# Patient Record
Sex: Female | Born: 1975 | Race: White | Hispanic: No | Marital: Married | Smoking: Never smoker
Health system: Southern US, Community
[De-identification: ages and names within clinical notes are randomized; demographics above are authoritative.]

## PROBLEM LIST (undated history)

## (undated) DIAGNOSIS — R002 Palpitations: Secondary | ICD-10-CM

## (undated) DIAGNOSIS — E7800 Pure hypercholesterolemia, unspecified: Secondary | ICD-10-CM

## (undated) DIAGNOSIS — G43909 Migraine, unspecified, not intractable, without status migrainosus: Secondary | ICD-10-CM

## (undated) DIAGNOSIS — N200 Calculus of kidney: Secondary | ICD-10-CM

## (undated) DIAGNOSIS — G43009 Migraine without aura, not intractable, without status migrainosus: Secondary | ICD-10-CM

## (undated) DIAGNOSIS — F419 Anxiety disorder, unspecified: Secondary | ICD-10-CM

## (undated) DIAGNOSIS — K219 Gastro-esophageal reflux disease without esophagitis: Secondary | ICD-10-CM

## (undated) DIAGNOSIS — Z9889 Other specified postprocedural states: Secondary | ICD-10-CM

## (undated) HISTORY — DX: Anxiety disorder, unspecified: F41.9

## (undated) HISTORY — DX: Other specified postprocedural states: Z98.890

## (undated) HISTORY — DX: Calculus of kidney: N20.0

## (undated) HISTORY — DX: Migraine, unspecified, not intractable, without status migrainosus: G43.909

## (undated) HISTORY — DX: Gastro-esophageal reflux disease without esophagitis: K21.9

## (undated) HISTORY — PX: OTHER SURGICAL HISTORY: SHX169

## (undated) HISTORY — DX: Pure hypercholesterolemia, unspecified: E78.00

## (undated) HISTORY — DX: Palpitations: R00.2

## (undated) HISTORY — DX: Migraine without aura, not intractable, without status migrainosus: G43.009

---
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
## 1997-07-12 ENCOUNTER — Emergency Department (HOSPITAL_COMMUNITY): Admission: EM | Admit: 1997-07-12 | Discharge: 1997-07-12 | Payer: Self-pay | Admitting: Emergency Medicine

## 1997-07-16 ENCOUNTER — Inpatient Hospital Stay (HOSPITAL_COMMUNITY): Admission: EM | Admit: 1997-07-16 | Discharge: 1997-07-17 | Payer: Self-pay | Admitting: Emergency Medicine

## 1999-09-12 ENCOUNTER — Other Ambulatory Visit: Admission: RE | Admit: 1999-09-12 | Discharge: 1999-09-12 | Payer: Self-pay | Admitting: *Deleted

## 2000-09-17 ENCOUNTER — Other Ambulatory Visit: Admission: RE | Admit: 2000-09-17 | Discharge: 2000-09-17 | Payer: Self-pay | Admitting: *Deleted

## 2001-09-10 ENCOUNTER — Other Ambulatory Visit: Admission: RE | Admit: 2001-09-10 | Discharge: 2001-09-10 | Payer: Self-pay | Admitting: Obstetrics & Gynecology

## 2003-01-28 ENCOUNTER — Emergency Department (HOSPITAL_COMMUNITY): Admission: EM | Admit: 2003-01-28 | Discharge: 2003-01-29 | Payer: Self-pay | Admitting: Emergency Medicine

## 2007-08-17 ENCOUNTER — Other Ambulatory Visit: Admission: RE | Admit: 2007-08-17 | Discharge: 2007-08-17 | Payer: Self-pay | Admitting: Obstetrics and Gynecology

## 2008-08-30 ENCOUNTER — Other Ambulatory Visit: Admission: RE | Admit: 2008-08-30 | Discharge: 2008-08-30 | Payer: Self-pay | Admitting: Obstetrics and Gynecology

## 2008-10-05 ENCOUNTER — Encounter: Admission: RE | Admit: 2008-10-05 | Discharge: 2008-10-05 | Payer: Self-pay | Admitting: Otolaryngology

## 2009-01-10 ENCOUNTER — Encounter: Payer: Self-pay | Admitting: Cardiology

## 2009-01-31 ENCOUNTER — Encounter: Payer: Self-pay | Admitting: Cardiology

## 2009-02-02 ENCOUNTER — Emergency Department (HOSPITAL_COMMUNITY): Admission: EM | Admit: 2009-02-02 | Discharge: 2009-02-03 | Payer: Self-pay | Admitting: Emergency Medicine

## 2009-02-07 ENCOUNTER — Ambulatory Visit (HOSPITAL_COMMUNITY): Admission: RE | Admit: 2009-02-07 | Discharge: 2009-02-07 | Payer: Self-pay | Admitting: Internal Medicine

## 2009-02-07 ENCOUNTER — Encounter: Payer: Self-pay | Admitting: Cardiology

## 2009-02-07 ENCOUNTER — Encounter (INDEPENDENT_AMBULATORY_CARE_PROVIDER_SITE_OTHER): Payer: Self-pay | Admitting: Internal Medicine

## 2009-02-07 ENCOUNTER — Ambulatory Visit: Payer: Self-pay | Admitting: Cardiology

## 2009-02-09 ENCOUNTER — Encounter: Payer: Self-pay | Admitting: Cardiology

## 2009-02-09 ENCOUNTER — Ambulatory Visit (HOSPITAL_COMMUNITY): Admission: RE | Admit: 2009-02-09 | Discharge: 2009-02-09 | Payer: Self-pay | Admitting: Internal Medicine

## 2009-03-06 DIAGNOSIS — Z9889 Other specified postprocedural states: Secondary | ICD-10-CM

## 2009-03-06 HISTORY — DX: Other specified postprocedural states: Z98.890

## 2009-03-14 DIAGNOSIS — R002 Palpitations: Secondary | ICD-10-CM | POA: Insufficient documentation

## 2009-03-15 ENCOUNTER — Ambulatory Visit: Payer: Self-pay | Admitting: Gastroenterology

## 2009-03-16 ENCOUNTER — Ambulatory Visit: Payer: Self-pay | Admitting: Cardiology

## 2009-03-16 DIAGNOSIS — K3189 Other diseases of stomach and duodenum: Secondary | ICD-10-CM | POA: Insufficient documentation

## 2009-03-16 DIAGNOSIS — R1013 Epigastric pain: Secondary | ICD-10-CM

## 2009-03-19 ENCOUNTER — Encounter: Payer: Self-pay | Admitting: Cardiology

## 2009-03-20 ENCOUNTER — Ambulatory Visit (HOSPITAL_COMMUNITY): Admission: RE | Admit: 2009-03-20 | Discharge: 2009-03-20 | Payer: Self-pay | Admitting: Gastroenterology

## 2009-03-20 ENCOUNTER — Ambulatory Visit: Payer: Self-pay | Admitting: Gastroenterology

## 2009-03-28 ENCOUNTER — Telehealth (INDEPENDENT_AMBULATORY_CARE_PROVIDER_SITE_OTHER): Payer: Self-pay | Admitting: *Deleted

## 2009-05-02 ENCOUNTER — Ambulatory Visit: Payer: Self-pay | Admitting: Gastroenterology

## 2009-05-18 ENCOUNTER — Encounter: Admission: RE | Admit: 2009-05-18 | Discharge: 2009-05-18 | Payer: Self-pay | Admitting: Otolaryngology

## 2009-09-03 ENCOUNTER — Other Ambulatory Visit: Admission: RE | Admit: 2009-09-03 | Discharge: 2009-09-03 | Payer: Self-pay | Admitting: Obstetrics and Gynecology

## 2009-09-03 ENCOUNTER — Encounter (INDEPENDENT_AMBULATORY_CARE_PROVIDER_SITE_OTHER): Payer: Self-pay | Admitting: *Deleted

## 2009-10-01 ENCOUNTER — Encounter: Payer: Self-pay | Admitting: Gastroenterology

## 2009-12-08 ENCOUNTER — Emergency Department (HOSPITAL_COMMUNITY)
Admission: EM | Admit: 2009-12-08 | Discharge: 2009-12-08 | Payer: Self-pay | Source: Home / Self Care | Admitting: Emergency Medicine

## 2010-02-05 NOTE — Letter (Signed)
Summary: US ABDOMEN  US ABDOMEN   Imported By: Faythe Ghee 03/19/2009 13:25:20  _____________________________________________________________________  External Attachment:    Type:   Image     Comment:   External Document

## 2010-02-05 NOTE — Letter (Signed)
Summary: NEXIUM 40MG   NEXIUM 40MG    Imported By: Rexene Alberts 10/01/2009 10:18:29  _____________________________________________________________________  External Attachment:    Type:   Image     Comment:   External Document  Appended Document: NEXIUM 40MG     Prescriptions: NEXIUM 40 MG CPDR (ESOMEPRAZOLE MAGNESIUM) Take 1 tablet by mouth 30 minute before she eats two times a day  #60 x 3   Entered and Authorized by:   Gerrit Halls NP   Signed by:   Gerrit Halls NP on 10/01/2009   Method used:   Faxed to ...       [facility] (retail)       [address]       [place], [place]  [address]       Ph: [phone]       Fax: [phone]   RxID:   [id number]

## 2010-02-05 NOTE — Letter (Signed)
Summary: Handout Printed  Printed Handout:  - Mitral Valve Prolapse (MVP)

## 2010-02-05 NOTE — Letter (Signed)
Summary: EKG  EKG   Imported By: Faythe Ghee 03/19/2009 13:27:07  _____________________________________________________________________  External Attachment:    Type:   Image     Comment:   External Document

## 2010-02-05 NOTE — Medication Information (Signed)
Summary: Tax adviser   Imported By: Rosine Beat 09/03/2009 09:15:15  _____________________________________________________________________  External Attachment:    Type:   Image     Comment:   External Document

## 2010-02-05 NOTE — Letter (Signed)
Summary: 2-D ECHO  2-D ECHO   Imported By: Faythe Ghee 03/19/2009 13:24:50  _____________________________________________________________________  External Attachment:    Type:   Image     Comment:   External Document

## 2010-02-05 NOTE — Letter (Signed)
Summary: EGD ORDER  EGD ORDER   Imported By: Ave Filter 03/15/2009 16:10:52  _____________________________________________________________________  External Attachment:    Type:   Image     Comment:   External Document

## 2010-02-05 NOTE — Assessment & Plan Note (Signed)
Summary: **per Dr.FAgan for palpitations/heart racing/tg   Visit Type:  Initial Consult Primary Provider:  Dr.Fagan  CC:  palps and heart racing.  History of Present Illness: Bridget Parker is a 35 year old married white female referred by Dr. Ouida Sills for chest pain and palpitations.  Her symptoms are started the past month or so. Before that she was exercising on a regular basis doing aerobics. At that time she was having no symptoms of presyncope or syncope, chest pain, or palpitations.  A 2-D echocardiogram was obtained favor the second 2011. It was normal except for some systolic bowing of the anterior leaflet of the mitral valve. There was no definite prolapse. No significant mitral regurgitation was seen.  Her palpitations are random. She is very anxious about this. Her chest pain is moving from different areas of her chest it is described sometimes as sharp stabbing pain.  She was seen by Dr. Darrick Penna of gastroenterology yesterday. She probably has some reflux symptoms. She is scheduled for endoscopy.  Current Medications (verified): 1)  Lorazepam 0.5 Mg Tabs (Lorazepam) .... As Needed 2)  Nexium 40 Mg Cpdr (Esomeprazole Magnesium) .... Take 1 Tablet By Mouth 30 Minute Before She Eats Two Times A Day 3)  Gas -X .Marland Kitchen.. As Needed 4)  Tums .... As Needed 5)  Nasonex 50 Mcg/act Susp (Mometasone Furoate) .... As Directed 6)  Allergy Shots .... One Weekly 7)  Lidocaine Viscous 2 % Soln (Lidocaine Hcl) .... 2 Tsp Every 4-6 Hours As Needed For Chest Pain. Take It 10 Minutes Before Eating. 8)  Ativan 0.5 Mg Tabs (Lorazepam) .... As Needed  Allergies (verified): 1)  ! Sulfa  Past History:  Past Medical History: Last updated: 03/14/2009 Current Problems:  PALPITATIONS (ICD-785.1)  Past Surgical History: Last updated: 03/14/2009 no surgical history noted  Review of Systems       negative other than history of present illness  Vital Signs:  Patient profile:   35 year old  female Weight:      120 pounds BMI:     22.76 Pulse rate:   68 / minute BP sitting:   128 / 78  Physical Exam  General:  pleasant, no acute distress, very anxious Head:  normocephalic and atraumatic Eyes:  PERRLA/EOM intact; conjunctiva and lids normal. Mouth:  Teeth, gums and palate normal. Oral mucosa normal. Neck:  Neck supple, no JVD. No masses, thyromegaly or abnormal cervical nodes. Chest Yunus Stoklosa:  no deformities or breast masses noted Lungs:  Clear bilaterally to auscultation and percussion. Heart:  regular rate and rhythm, normal S1-S2, intermittent click, no murmur. Abdomen:  Bowel sounds positive; abdomen soft and non-tender without masses, organomegaly, or hernias noted. No hepatosplenomegaly. Msk:  Back normal, normal gait. Muscle strength and tone normal. Pulses:  pulses normal in all 4 extremities Extremities:  No clubbing or cyanosis. Neurologic:  Alert and oriented x 3. Skin:  Intact without lesions or rashes. Psych:  Normal affect.   EKG  Procedure date:  03/16/2009  Findings:      sinus tachycardia, nonspecific ST segment changes, borderline short PR interval, normal QRS, normal QTC.  Impression & Recommendations:  Problem # 1:  PALPITATIONS (ICD-785.1) Assessment New I think these are benign and related to minimal mitral valve prolapse by exam and by flat closure of the mitral valve on echo. I've encouraged her to exercise, encourage good dietary habits and rest. I have encouraged her to go ahead and have her endoscopy. She probably has some reflux symptoms. She can use  her anxiety lytic p.r.n. I have reassured her that she will not need surgery and she can proceed with trying to get pregnant. I'll see her back p.r.n.  Problem # 2:  DYSPEPSIA (ICD-536.8)  Other Orders: Consultation Level IV (91478)  Patient Instructions: 1)  Your physician recommends that you schedule a follow-up appointment in: as needed  2)  Your physician recommends that you continue on  your current medications as directed. Please refer to the Current Medication list given to you today.

## 2010-02-05 NOTE — Assessment & Plan Note (Signed)
Summary: DYSPEPSIA, GASTRITIS   Visit Type:  Follow-up Visit Primary Care Provider:  Ouida Sills, M.D.  Chief Complaint:  F/U dyspepsia.  History of Present Illness: After the Nexium paineased up and no more pain. If it flares takes TUMs or Gas-x. No need for Elavil. Modifying diet. Staying away from spicy stuff. 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 Just got back from the beach. Still consumes milk and doesn't seem to bother her.  Nexium causes a more regular BM. BM was q2days and now qday.  Current Medications (verified): 1)  Nexium 40 Mg Cpdr (Esomeprazole Magnesium) .... Take 1 Tablet By Mouth 30 Minute Before She Eats Two Times A Day 2)  Gas -X .Marland Kitchen.. As Needed 3)  Tums .... As Needed 4)  Allergy Shots .... One Weekly 5)  Flonase 50 Mcg/act Susp (Fluticasone Propionate) .... Daily As Directed 6)  Zyrtec Hives Relief 10 Mg Tabs (Cetirizine Hcl) .... Take 1 Tablet By Mouth Once A Day 7)  Tylenol .... As Needed 8)  Benadryl .... As Needed 9)  Multi-Vitamin .... Take 1 Tablet By Mouth Once A Day  Allergies (verified): 1)  ! Sulfa  Past History:  Past Medical History: Reviewed history from 03/15/2009 and no changes required. Current Problems:  PALPITATIONS (ICD-785.1) Anxiety Disorder GERD  Social History: Married x 8 years. Trying to have kids. Occupation: former Haematologist, unemployed since 2009 Patient has never smoked.  Alcohol Use - no Illicit Drug Use - no  Vital Signs:  Patient profile:   35 year old female Height:      61 inches Weight:      116 pounds BMI:     22.00 Temp:     98.1 degrees F oral Pulse rate:   72 / minute BP sitting:   102 / 70  (left arm) Cuff size:   regular  Vitals Entered By: Cloria Spring LPN (May 02, 2009 10:38 AM)  Physical Exam  General:  Well developed, well nourished, no acute distress. Head:  Normocephalic and atraumatic. Eyes:  PERRLA, no icterus. Mouth:  No deformity or lesions, dentition normal. Neck:  Supple; no  masses. Lungs:  Clear throughout to auscultation. Heart:  Regular rate and rhythm; no murmurs. Abdomen:  Soft, nontender and nondistended. Normal bowel sounds.  Impression & Recommendations:  Problem # 1:  DYSPEPSIA (ICD-536.8) Assessment Improved  2o to gastritis. Continue Nexium BID. In June 2011 may try once daily dosing. OPV in 12 mos.   CC: PCP  Orders: Est. Patient Level II (64403)  Appended Document: DYSPEPSIA, GASTRITIS reminder in computer

## 2010-02-05 NOTE — Progress Notes (Signed)
Summary: palpitations  Phone Note Call from Patient Call back at Home Phone [phone]   Caller: pt Reason for Call: Talk to Nurse Summary of Call: per pt calling because last night she was laying bed and her heart started racing, plus it happened during the day when she was out and about.  Initial call taken by: Faythe Ghee,  March 28, 2009 2:54 PM  Follow-up for Phone Call        I spoke with Caryl Asp at length, she has no snyope with these episodes, saw Dr. Daleen Squibb on 3/11, with echo, believed to be anxiety per MD, gave pt info for [facility] and call us if needed Follow-up by: Teressa Lower RN,  March 29, 2009 9:02 AM

## 2010-02-05 NOTE — Letter (Signed)
Summary: LABS  LABS   Imported By: Faythe Ghee 03/19/2009 13:25:54  _____________________________________________________________________  External Attachment:    Type:   Image     Comment:   External Document

## 2010-02-05 NOTE — Assessment & Plan Note (Signed)
Summary: NPP,GERD,GLU   Visit Type:  Initial Consult Referring Provider:  Ouida Sills Primary Care Provider:  Ouida Sills, M.D.  Chief Complaint:  GERD.  History of Present Illness: Having pain in left chest and middle of chest. Taking Nexium for past week. Failed OMP for 3-4 weeks. Comes on and feels like gonna have a heart attack. Cardiac workup pending.  Sharp shooting: starte a month ago. Always had indigestion(age 20s), rx: Tagamet.TUMS and it helped. Lost weight: everytime she eats gets pain, nausea. gets hungry but has no appetite. Taking Gas-x: doesn't really help. No ASA, BC, goody, or Ibuprofen. Aleve when she has a headache. Taking Allergy shots in SEP. No pain with swallowing and no problems swallowing. on Ativan cause she feels like she's having a MI. Stressed about the chest pain. No other concerns. Diarrhea eating things that don't agree with her. No blood in stool or constip[ation. No black stool.  Preventive Screening-Counseling & Management  Alcohol-Tobacco     Smoking Status: never      Drug Use:  no.    Current Medications (verified): 1)  Lorazepam 0.5 Mg Tabs (Lorazepam) .... As Needed 2)  Nexium 40 Mg Cpdr (Esomeprazole Magnesium) .... Take 1 Tablet By Mouth Two Times A Day 3)  Gas -X .Marland Kitchen.. As Needed 4)  Tums .... As Needed 5)  Nasonex 50 Mcg/act Susp (Mometasone Furoate) .... As Directed 6)  Allergy Shots .... One Weekly  Allergies (verified): 1)  ! Sulfa  Past History:  Past Medical History: Current Problems:  PALPITATIONS (ICD-785.1) Anxiety Disorder GERD  Past Surgical History: Kodney stones in her 86s Tonsillectomy  Family History: FH of Colon Cancer: ? grandfather Mother has reflux bad-no surgery  Social History: Married x 8 years. Trying to have kids. 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  Alcohol Use - no Illicit Drug Use - no Smoking Status:  never Drug Use:  no  Review of Systems       2007: 135 lbs. No H. pylori serology  checked. Was consuming milk w/ cereal every day-->1x/week. No cheese or ice cream.  Per HPI otherwise all systems.  Vital Signs:  Patient profile:   35 year old female Height:      61 inches Weight:      120 pounds BMI:     22.76 Temp:     98.0 degrees F oral Pulse rate:   80 / minute BP sitting:   100 / 80  (left arm) Cuff size:   regular  Vitals Entered By: Cloria Spring LPN (March 15, 2009 3:12 PM)  Impression & Recommendations: May drink Ensure. [facility] GERD handout. Boost or Ensure three times a day. EGD ASAP.  Other Orders: Consultation Level IV (29562) Prescriptions: LIDOCAINE VISCOUS 2 % SOLN (LIDOCAINE HCL) 2 tsp every 4-6 hours as needed for chest pain. Take it 10 minutes before eating.  #200 ml x 5   Entered and Authorized by:   West Bali MD   Signed by:   West Bali MD on 03/15/2009   Method used:   Electronically to        [facility] (retail)       [address]       [place], [place]  [address]       Ph: [phone]       Fax: [phone]   RxID:   [id number] NEXIUM 40 MG CPDR (ESOMEPRAZOLE MAGNESIUM) Take 1 tablet by mouth 30  minute before she eats two times a day  #60 x 5   Entered and Authorized by:   West Bali MD   Signed by:   West Bali MD on 03/15/2009   Method used:   Electronically to        [facility] (retail)       [address]       [place], [place]  [address]       Ph: [phone]       Fax: [phone]   RxID:   [id number]      Appended Document: NPP,GERD,GLU Pt has chest and epigastric pain. Differential diagnosis includes uncontrolled GERD, reflux esophagitis, H. pylori gastritis, and low likelihood of lactose intolerance, celiac sprue, or eosinophilic GE.   Appended Document: NPP,GERD,GLU AUG 2006: c/o chest pain, ? GERD, Rx: Protonix NOV 2009: 129 LBS APR 2010: 131 LBS JAN 2011: 134 LBS FEB 2011: cip 250 mg two times a  day for 3 days. Begin Paxil 10 mg once daily. ECHO: NL, ABD U/s: WNLS, WBC 10.6 HB 13.6 PLT 380 K 4.4 CR 0.79 TSH 2.325

## 2010-02-05 NOTE — Letter (Signed)
Summary: PROGRESS NOTE  PROGRESS NOTE   Imported By: Faythe Ghee 03/19/2009 13:30:17  _____________________________________________________________________  External Attachment:    Type:   Image     Comment:   External Document

## 2010-03-19 LAB — DIFFERENTIAL
Basophils Absolute: 0 10*3/uL (ref 0.0–0.1)
Basophils Relative: 0 % (ref 0–1)
Eosinophils Absolute: 0.1 10*3/uL (ref 0.0–0.7)
Eosinophils Relative: 1 % (ref 0–5)
Lymphocytes Relative: 24 % (ref 12–46)
Lymphs Abs: 2.3 10*3/uL (ref 0.7–4.0)
Monocytes Absolute: 0.8 10*3/uL (ref 0.1–1.0)
Monocytes Relative: 8 % (ref 3–12)
Neutro Abs: 6.5 10*3/uL (ref 1.7–7.7)
Neutrophils Relative %: 67 % (ref 43–77)

## 2010-03-19 LAB — CBC
HCT: 39.3 % (ref 36.0–46.0)
Hemoglobin: 13.4 g/dL (ref 12.0–15.0)
MCH: 30.5 pg (ref 26.0–34.0)
MCHC: 34.1 g/dL (ref 30.0–36.0)
MCV: 89.5 fL (ref 78.0–100.0)
Platelets: 311 10*3/uL (ref 150–400)
RBC: 4.39 MIL/uL (ref 3.87–5.11)
RDW: 12.4 % (ref 11.5–15.5)
WBC: 9.7 10*3/uL (ref 4.0–10.5)

## 2010-03-19 LAB — URINALYSIS, ROUTINE W REFLEX MICROSCOPIC
Bilirubin Urine: NEGATIVE
Glucose, UA: 250 mg/dL — AB
Leukocytes, UA: NEGATIVE
Nitrite: POSITIVE — AB
Protein, ur: 100 mg/dL — AB
Specific Gravity, Urine: 1.02 (ref 1.005–1.030)
Urobilinogen, UA: >8 mg/dL — ABNORMAL HIGH (ref 0.0–1.0)
pH: 6.5 (ref 5.0–8.0)

## 2010-03-19 LAB — URINE MICROSCOPIC-ADD ON

## 2010-03-19 LAB — BASIC METABOLIC PANEL
BUN: 9 mg/dL (ref 6–23)
CO2: 29 mEq/L (ref 19–32)
Calcium: 9.2 mg/dL (ref 8.4–10.5)
Chloride: 102 mEq/L (ref 96–112)
Creatinine, Ser: 0.84 mg/dL (ref 0.4–1.2)
GFR calc Af Amer: >60 mL/min (ref >60)
GFR calc non Af Amer: >60 mL/min (ref >60)
Glucose, Bld: 99 mg/dL (ref 70–99)
Potassium: 3.8 mEq/L (ref 3.5–5.1)
Sodium: 137 mEq/L (ref 135–145)

## 2010-03-25 LAB — URINALYSIS, ROUTINE W REFLEX MICROSCOPIC
Bilirubin Urine: NEGATIVE
Glucose, UA: NEGATIVE mg/dL
Hgb urine dipstick: NEGATIVE
Ketones, ur: NEGATIVE mg/dL
Nitrite: NEGATIVE
Protein, ur: NEGATIVE mg/dL
Specific Gravity, Urine: 1.01 (ref 1.005–1.030)
Urobilinogen, UA: 0.2 mg/dL (ref 0.0–1.0)
pH: 8 (ref 5.0–8.0)

## 2010-03-25 LAB — BASIC METABOLIC PANEL
BUN: 16 mg/dL (ref 6–23)
CO2: 28 mEq/L (ref 19–32)
Calcium: 9.8 mg/dL (ref 8.4–10.5)
Chloride: 102 mEq/L (ref 96–112)
Creatinine, Ser: 0.87 mg/dL (ref 0.4–1.2)
GFR calc Af Amer: >60 mL/min (ref >60)
GFR calc non Af Amer: >60 mL/min (ref >60)
Glucose, Bld: 127 mg/dL — ABNORMAL HIGH (ref 70–99)
Potassium: 3.3 mEq/L — ABNORMAL LOW (ref 3.5–5.1)
Sodium: 138 mEq/L (ref 135–145)

## 2010-03-25 LAB — DIFFERENTIAL
Basophils Absolute: 0 10*3/uL (ref 0.0–0.1)
Basophils Relative: 0 % (ref 0–1)
Eosinophils Absolute: 0.1 10*3/uL (ref 0.0–0.7)
Eosinophils Relative: 1 % (ref 0–5)
Lymphocytes Relative: 19 % (ref 12–46)
Lymphs Abs: 2.2 10*3/uL (ref 0.7–4.0)
Monocytes Absolute: 0.9 10*3/uL (ref 0.1–1.0)
Monocytes Relative: 8 % (ref 3–12)
Neutro Abs: 8.3 10*3/uL — ABNORMAL HIGH (ref 1.7–7.7)
Neutrophils Relative %: 72 % (ref 43–77)

## 2010-03-25 LAB — POCT CARDIAC MARKERS
CKMB, poc: <1 ng/mL — ABNORMAL LOW (ref 1.0–8.0)
Myoglobin, poc: 63.8 ng/mL (ref 12–200)
Troponin i, poc: <0.05 ng/mL (ref 0.00–0.09)

## 2010-03-25 LAB — PREGNANCY, URINE: Preg Test, Ur: NEGATIVE

## 2010-03-25 LAB — CBC
HCT: 39.7 % (ref 36.0–46.0)
Hemoglobin: 13.3 g/dL (ref 12.0–15.0)
MCHC: 33.7 g/dL (ref 30.0–36.0)
MCV: 93.1 fL (ref 78.0–100.0)
Platelets: 361 10*3/uL (ref 150–400)
RBC: 4.26 MIL/uL (ref 3.87–5.11)
RDW: 12.3 % (ref 11.5–15.5)
WBC: 11.5 10*3/uL — ABNORMAL HIGH (ref 4.0–10.5)

## 2010-05-20 ENCOUNTER — Other Ambulatory Visit: Payer: Self-pay

## 2010-05-22 MED ORDER — ESOMEPRAZOLE MAGNESIUM 40 MG PO CPDR: 40.0000 mg | DELAYED_RELEASE_CAPSULE | Freq: Every day | ORAL | Status: DC

## 2010-05-22 NOTE — Telephone Encounter (Signed)
Needs yearly OV.

## 2010-05-22 NOTE — Telephone Encounter (Signed)
Pt was informed, one refill given and needs yearly OV.

## 2010-05-29 NOTE — Telephone Encounter (Signed)
Pt is aware of OV for 05/30/10 @ 1000 with AS

## 2010-05-30 ENCOUNTER — Encounter: Payer: Self-pay | Admitting: Gastroenterology

## 2010-05-30 ENCOUNTER — Ambulatory Visit (INDEPENDENT_AMBULATORY_CARE_PROVIDER_SITE_OTHER): Payer: Self-pay | Admitting: Gastroenterology

## 2010-05-30 VITALS — BP 104/66 | HR 89 | Temp 98.0°F | Ht 61.0 in | Wt 118.0 lb

## 2010-05-30 DIAGNOSIS — K219 Gastro-esophageal reflux disease without esophagitis: Secondary | ICD-10-CM

## 2010-05-30 MED ORDER — ESOMEPRAZOLE MAGNESIUM 40 MG PO CPDR: 40.0000 mg | DELAYED_RELEASE_CAPSULE | Freq: Two times a day (BID) | ORAL | Status: AC

## 2010-05-30 NOTE — Progress Notes (Signed)
Referring Provider: No ref. provider found Primary Care Physician:  Carylon Perches, MD Primary Gastroenterologist: Dr. Darrick Penna   Chief Complaint  Patient presents with  . Medication Refill    HPI:   Ms. Bisaillon is a pleasant 35 year old female who presents in routine f/u for GERD. Needs refills. She has had a prior work-up to include [place] of abd, wnl, and an EGD in March 2011 showing gastritis. She has been on Nexium daily, doing well. At times she will take BID, but this is rare. GERD is much better than last year. Denies dysphagia. Continues to watch what she eats, avoiding spicy foods, which exacerbate GERD. Denies abdominal pain, n/v. BM regular. Feels Nexium has helped with this.   Past Medical History  Diagnosis Date  . GERD (gastroesophageal reflux disease)   . Anxiety disorder   . Palpitations   . S/P endoscopy March 2011    mild gastritis, continue Nexium    Past Surgical History  Procedure Date  . Kidney stones     IN HER 20s  . Tonsillectomy     Current Outpatient Prescriptions  Medication Sig Dispense Refill  . fluticasone (FLONASE) 50 MCG/ACT nasal spray 2 sprays by Nasal route daily.        Marland Kitchen LOESTRIN 24 FE 1-20 MG-MCG TABS       . Multiple Vitamin (MULTIVITAMIN) tablet Take 1 tablet by mouth daily.        . sertraline (ZOLOFT) 50 MG tablet       . DISCONTD: esomeprazole (NEXIUM) 40 MG capsule Take 1 capsule (40 mg total) by mouth daily.  31 capsule  0  . ALPRAZolam (XANAX) 0.25 MG tablet         Allergies as of 05/30/2010 - Review Complete 05/30/2010  Allergen Reaction Noted  . Sulfonamide derivatives      History   Social History  . Marital Status: Married    Spouse Name: N/A    Number of Children: 0  . Years of Education: N/A   Social History Main Topics  . Smoking status: Never Smoker   . Smokeless tobacco: None  . Alcohol Use: No  . Drug Use: No  . Sexually Active: None    Review of Systems: Gen: Denies fever, chills, anorexia. Denies fatigue,  weakness, weight loss.  CV: Denies chest pain, palpitations, syncope, peripheral edema, and claudication. Resp: Denies dyspnea at rest, cough, wheezing, coughing up blood, and pleurisy. GI: Denies vomiting blood, jaundice, and fecal incontinence.   Denies dysphagia or odynophagia. Derm: Denies rash, itching, dry skin Psych: Denies depression, anxiety, memory loss, confusion. No homicidal or suicidal ideation.  Heme: Denies bruising, bleeding, and enlarged lymph nodes.  Physical Exam: BP 104/66  Pulse 89  Temp(Src) 98 F (36.7 C) (Temporal)  Ht 5\' 1"  (1.549 m)  Wt 118 lb (53.524 kg)  BMI 22.30 kg/m2  LMP 05/09/2010 General:   Alert and oriented. No distress noted. Pleasant and cooperative.  Head:  Normocephalic and atraumatic. Eyes:  Conjuctiva clear without scleral icterus. Mouth:  Oral mucosa pink and moist. Good dentition. No lesions. Neck:  Supple, without mass or thyromegaly. Heart:  S1, S2 present without murmurs, rubs, or gallops. Regular rate and rhythm. Lungs: Clear to auscultation bilaterally Abdomen:  +BS, soft, non-tender and non-distended. No rebound or guarding. No HSM or masses noted. Msk:  Symmetrical without gross deformities. Normal posture. Extremities:  Without edema. Neurologic:  Alert and  oriented x4;  grossly normal neurologically. Skin:  Intact without significant lesions or  rashes. Psych:  Alert and cooperative. Normal mood and affect.

## 2010-05-30 NOTE — Progress Notes (Signed)
Cc to PCP 

## 2010-05-30 NOTE — Assessment & Plan Note (Signed)
35 year old Caucasian female with hx of GERD, well-controlled now on Nexium daily. At times will take BID. Prior work-up includes normal abdominal US, mild gastritis on EGD. No dysphagia, N/V. No abdominal pain. Doing quite well. Will refill Nexium, disp #62 with 11 refills. Pt informed to continue daily dosing, and may use BID if needed, although this is not likely.  Nexium refills Return in 2 years or sooner if needed

## 2010-05-30 NOTE — Patient Instructions (Signed)
Continue Nexium as discussed.  Contact our office if any changes, otherwise we will see you back in 2 years.  See prescription card with samples; this should hopefully help with the cost of Nexium monthly.

## 2010-06-07 NOTE — Progress Notes (Signed)
agree

## 2010-06-17 ENCOUNTER — Other Ambulatory Visit: Payer: Self-pay | Admitting: Gastroenterology

## 2010-10-11 ENCOUNTER — Other Ambulatory Visit (HOSPITAL_COMMUNITY)
Admission: RE | Admit: 2010-10-11 | Discharge: 2010-10-11 | Disposition: A | Discharge status: Home / Self Care | Payer: BC Managed Care – PPO | Source: Ambulatory Visit | Attending: Obstetrics and Gynecology | Admitting: Obstetrics and Gynecology

## 2010-10-11 ENCOUNTER — Other Ambulatory Visit: Payer: Self-pay | Admitting: Adult Health

## 2010-10-11 DIAGNOSIS — Z01419 Encounter for gynecological examination (general) (routine) without abnormal findings: Secondary | ICD-10-CM | POA: Insufficient documentation

## 2011-02-20 ENCOUNTER — Telehealth: Payer: Self-pay | Admitting: Gastroenterology

## 2011-02-20 MED ORDER — DEXLANSOPRAZOLE 60 MG PO CPDR: 60.0000 mg | DELAYED_RELEASE_CAPSULE | Freq: Every day | ORAL | Status: DC

## 2011-02-20 NOTE — Telephone Encounter (Signed)
Tried to call pt, NA. 

## 2011-02-20 NOTE — Telephone Encounter (Signed)
Has tried Protonix, Prevacid, Omeprazole, Tagament. Insurance will not cover Nexium unless trying Dexilant. Rx sent for Dexilant.  Please inform pt.

## 2011-02-21 NOTE — Telephone Encounter (Signed)
Pt is aware of Rx at the drug store

## 2012-01-29 ENCOUNTER — Other Ambulatory Visit: Payer: Self-pay | Admitting: Gastroenterology

## 2012-03-18 ENCOUNTER — Other Ambulatory Visit: Payer: Self-pay | Admitting: Physician Assistant

## 2012-05-16 ENCOUNTER — Encounter (HOSPITAL_COMMUNITY): Payer: Self-pay | Admitting: Emergency Medicine

## 2012-05-16 ENCOUNTER — Emergency Department (HOSPITAL_COMMUNITY)
Admission: EM | Admit: 2012-05-16 | Discharge: 2012-05-16 | Disposition: A | Discharge status: Home / Self Care | Payer: Managed Care, Other (non HMO) | Source: Home / Self Care

## 2012-05-16 DIAGNOSIS — J309 Allergic rhinitis, unspecified: Secondary | ICD-10-CM

## 2012-05-16 DIAGNOSIS — J302 Other seasonal allergic rhinitis: Secondary | ICD-10-CM

## 2012-05-16 LAB — POCT RAPID STREP A: Streptococcus, Group A Screen (Direct): NEGATIVE

## 2012-05-16 MED ORDER — AZITHROMYCIN 250 MG PO TABS: ORAL_TABLET | ORAL | Status: DC

## 2012-05-16 MED ORDER — FLUTICASONE PROPIONATE 50 MCG/ACT NA SUSP: 2.0000 | Freq: Every day | NASAL | Status: AC

## 2012-05-16 MED ORDER — FLUTICASONE PROPIONATE 50 MCG/ACT NA SUSP: 2.0000 | Freq: Every day | NASAL | Status: DC

## 2012-05-16 NOTE — ED Notes (Signed)
Pt waiting for rx then will discharge.

## 2012-05-16 NOTE — ED Provider Notes (Signed)
Bridget Parker is a 37 y.o. female who presents to Urgent Care today for stuffy runny nose sore throat cough itchy watery eyes and congestion along with sinus headache present for the last week or so. Patient has tried over-the-counter ibuprofen as well as allergy medication. She is using antihistamine eyedrops for her itchy eyes. She feels well otherwise with no fevers or chills shortness of breath. She thinks she may have a sinus infection or cold.    PMH reviewed. History of seasonal allergies History  Substance Use Topics  . Smoking status: Never Smoker   . Smokeless tobacco: Not on file  . Alcohol Use: No   ROS as above Medications reviewed. No current facility-administered medications for this encounter.   Current Outpatient Prescriptions  Medication Sig Dispense Refill  . ALPRAZolam (XANAX) 0.25 MG tablet       . DEXILANT 60 MG capsule TAKE (1) CAPSULE BY MOUTH EVERY DAY  30 capsule  11  . LOESTRIN 24 FE 1-20 MG-MCG TABS       . sertraline (ZOLOFT) 50 MG tablet       . azithromycin (ZITHROMAX) 250 MG tablet 2 pill po day 1 1 pill po days 2-5  6 each  0  . fluticasone (FLONASE) 50 MCG/ACT nasal spray Place 2 sprays into the nose daily.  16 g  6  . Multiple Vitamin (MULTIVITAMIN) tablet Take 1 tablet by mouth daily.          Exam:  BP 110/69  Pulse 80  Temp(Src) 98.6 F (37 C) (Oral)  Resp 16  SpO2 100%  LMP 05/06/2012 Gen: Well NAD HEENT: EOMI,  MMM, normal tympanic membranes bilaterally. Erythematous nasal turbinates. Posterior pharynx shows cobblestoning. No cervical lymphadenopathy.  Lungs: CTABL Nl WOB Heart: RRR no MRG Abd: NABS, NT, ND Exts: Non edematous BL  LE, warm and well perfused.   Results for orders placed during the hospital encounter of 05/16/12 (from the past 24 hour(s))  POCT RAPID STREP A ([facility] CARE ONLY)     Status: None   Collection Time    05/16/12 11:54 AM      Result Value Range   Streptococcus, Group A Screen (Direct) NEGATIVE  NEGATIVE    No results found.  Assessment and Plan: 37 y.o. female with seasonal allergies likely. Plan to treat with Flonase nasal spray and Zyrtec. Possible sinus infection however this is very unlikely. We'll prescribe Zyrtec for use if not better in one week. Discussed the plan with the patient expresses understanding and agreement.  Discussed warning signs or symptoms. Please see discharge instructions.     Rodolph Bong, MD 05/16/12 [phone]

## 2012-05-16 NOTE — ED Notes (Signed)
Pt c/o sore throat. Sinus pressure and pain. Sneezing and bilateral ear pain x 2 days  "hurts all over" pt has used otc meds with no relief in symptoms.  Pt denies fever, n/v/d

## 2012-05-17 NOTE — ED Provider Notes (Signed)
Medical screening examination/treatment/procedure(s) were performed by resident physician or non-physician practitioner and as supervising physician I was immediately available for consultation/collaboration.   Barkley Bruns MD.   Linna Hoff, MD 05/17/12 [facility]

## 2012-06-01 ENCOUNTER — Encounter: Payer: Self-pay | Admitting: General Practice

## 2012-08-02 ENCOUNTER — Telehealth: Payer: Self-pay | Admitting: Gastroenterology

## 2012-08-02 NOTE — Telephone Encounter (Signed)
LMOM that samples are ready for pick up. Dexilant 60 mg # 10 and one daily.

## 2012-08-02 NOTE — Telephone Encounter (Signed)
JL is working on a PA for patient to get her Dexilant refilled, but was checking to see if she could get a few samples to hold her until she is able to get rx filled. [phone] she is a [facility] patient

## 2012-08-02 NOTE — Telephone Encounter (Signed)
agree

## 2015-05-15 ENCOUNTER — Encounter: Payer: Self-pay | Admitting: Neurology

## 2015-05-15 ENCOUNTER — Ambulatory Visit (INDEPENDENT_AMBULATORY_CARE_PROVIDER_SITE_OTHER): Payer: BLUE CROSS/BLUE SHIELD | Admitting: Neurology

## 2015-05-15 VITALS — BP 114/77 | HR 91 | Ht 61.0 in | Wt 130.4 lb

## 2015-05-15 DIAGNOSIS — G43009 Migraine without aura, not intractable, without status migrainosus: Secondary | ICD-10-CM

## 2015-05-15 DIAGNOSIS — G43709 Chronic migraine without aura, not intractable, without status migrainosus: Secondary | ICD-10-CM | POA: Diagnosis not present

## 2015-05-15 MED ORDER — METOCLOPRAMIDE HCL 10 MG PO TABS: 10.0000 mg | ORAL_TABLET | Freq: Three times a day (TID) | ORAL | Status: DC | PRN

## 2015-05-15 MED ORDER — PROPRANOLOL HCL 10 MG PO TABS: 10.0000 mg | ORAL_TABLET | Freq: Two times a day (BID) | ORAL | Status: DC

## 2015-05-15 NOTE — Progress Notes (Signed)
[facility]    Provider:  Dr Lucia Gaskins Referring Provider: Benita Stabile, MD Primary Care Physician:  Dwana Melena, MD  CC:  Migraines  HPI:  Bridget Parker is a 40 y.o. female here as a referral from Dr. Margo Aye for migraines. Migraines started since the age of 3. Worsening in the last 3-5 years. She has 15 headaches a month. Pressure, throbbing, they start on both sides of the head but can start unilaterally on the left mostly. Typically behind the eyes, forehead, sides, pounding, throbbing. No light sensitivity or sound sensitivity. She has sensitivity to smells such as cleaning products or candles or tobacco. Smells trigger the headaches. +nausea but no vomiting. She has a pressure component to her headache. Headache is positional and worse with bending over. She has woken up with the headaches but they usually develop later in the day. They can last for days and they can be severe enough to keep her out work at 8/10 in pain. They are on average 7/10. They are bad. Significantly affecting her life. No FHx of migraines, mother has allergy headaches. The quality hasn;t changed over the years, but the severity and frequency is worse. Imitrex pill helps, she takes them 10x a month or so. She has tried Zoloft in the past without relief and with xanax and gained weight and she was tired a lot. She recently was prescribed Keppra. No weight gain recently. She was seen by a headache clinic recently and is here for a second opinion on management. No other focal neurologic symptoms. No vision changes at all, no hearing changing, no neck pain, no meningismus. No aura.   Reviewed notes, labs and imaging from outside physicians, which showed:  Tried: Keppra, Nortriptyline, Imitrex.  Personally reviewed images and agree with the following: MRI of the brain 09/2008 personally reviewed: The brain itself has a normal appearance without evidence of malformation, atrophy, old or acute infarction, mass  lesion, hemorrhage, hydrocephalus or extra-axial collection. The pituitary gland is normal. The paranasal sinuses, middle ears and mastoids are clear. There is no anomalous carotid or jugular anatomy. The seventh and eighth nerve complexes appear normal bilaterally. No sign of dural AV fistula. No abnormal contrast enhancement in the region.  IMPRESSION: Normal examination. No cause of pulsatile tinnitus identified.  Reviewed notes from [facility]: Imitrex helps headaches. She tried Nortriptyline but then stopped because she didn't want to be on an anti-depressant. Also did not want to gain weight. She was given Ketoprofen  prn, Imitrex  prn and Librium for anxiety. Melatonin at bedtime. 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 Diagnosed with migraine and tension type headache. They discussed acupuncture and cephaly, magnesium, roboflavin and coq10, beta blockers and anti-epilepsy medications used in migraine. He started Keppra  qhs with titration to  qhs. Oxaprozen prn.   Review of Systems: Patient complains of symptoms per HPI as well as the following symptoms: headache, insomnia, sleepiness, kidney stones. Pertinent negatives per HPI. All others negative.   Social History   Social History  . Marital Status: Married    Spouse Name: Bridget Parker   . Number of Children: 0  . Years of Education: some colle   Occupational History  . [employer]    Social History Main Topics  . Smoking status: Never Smoker   . Smokeless tobacco: Not on file  . Alcohol Use: No  . Drug Use: No  . Sexual Activity: Yes    Birth  Control/ Protection: Pill   Other Topics Concern  . Not on file   Social History Narrative   Lives with husband   Caffeine use: 1-3 times per week (coffee)    Family History  Problem Relation Age of Onset  . Prostate cancer      grandfather  . Lung cancer      grandfather  . Diabetes       grandfather  . Cancer      grandmother  . Ovarian cancer      Mother's sister (twin)  . Migraines Mother     Past Medical History  Diagnosis Date  . GERD (gastroesophageal reflux disease)   . Anxiety disorder   . Palpitations   . S/P endoscopy March 2011    mild gastritis, continue Nexium  . Kidney stones   . High cholesterol   . Migraines     Past Surgical History  Procedure Laterality Date  . Kidney stones      IN HER 20s  . Tonsillectomy  1983    Current Outpatient Prescriptions  Medication Sig Dispense Refill  . chlordiazePOXIDE (LIBRIUM) 5 MG capsule Take 5 mg by mouth every morning.    . fluticasone (FLONASE) 50 MCG/ACT nasal spray Place 2 sprays into the nose daily. 16 g 6  . LOESTRIN 24 FE 1-20 MG-MCG TABS     . metoCLOPramide (REGLAN) 10 MG tablet Take 1 tablet (10 mg total) by mouth 3 (three) times daily as needed. 30 tablet 12  . propranolol (INDERAL) 10 MG tablet Take 1 tablet (10 mg total) by mouth 2 (two) times daily. 60 tablet 12   No current facility-administered medications for this visit.    Allergies as of 05/15/2015 - Review Complete 05/15/2015  Allergen Reaction Noted  . Sulfonamide derivatives      Vitals: BP 114/77 mmHg  Pulse 91  Ht 5\' 1"  (1.549 m)  Wt 130 lb 6.4 oz (59.149 kg)  BMI 24.65 kg/m2 Last Weight:  Wt Readings from Last 1 Encounters:  05/15/15 130 lb 6.4 oz (59.149 kg)   Last Height:   Ht Readings from Last 1 Encounters:  05/15/15 5\' 1"  (1.549 m)   Physical exam: Exam: Gen: NAD, conversant                    CV: RRR, no MRG. No Carotid Bruits. No peripheral edema, warm, nontender Eyes: Conjunctivae clear without exudates or hemorrhage  Neuro: Detailed Neurologic Exam  Speech:    Speech is normal; fluent and spontaneous with normal comprehension.  Cognition:    The patient is oriented to person, place, and time;     recent and remote memory intact;     language fluent;     normal attention, concentration,      fund of knowledge Cranial Nerves:    The pupils are equal, round, and reactive to light. The fundi are normal and spontaneous venous pulsations are present. Visual fields are full to finger confrontation. Extraocular movements are intact. Trigeminal sensation is intact and the muscles of mastication are normal. The face is symmetric. The palate elevates in the midline. Hearing intact. Voice is normal. Shoulder shrug is normal. The tongue has normal motion without fasciculations.   Coordination:    Normal finger to nose and heel to shin. Normal rapid alternating movements.   Gait:    Heel-toe and tandem gait are normal.   Motor Observation:    No asymmetry, no atrophy, and no involuntary movements noted.  Tone:    Normal muscle tone.    Posture:    Posture is normal. normal erect    Strength:    Strength is V/V in the upper and lower limbs.      Sensation: intact to LT     Reflex Exam:  DTR's:    Deep tendon reflexes in the upper and lower extremities are normal bilaterally.   Toes:    The toes are downgoing bilaterally.   Clonus:    Clonus is absent.       Assessment/Plan:  40 year old with a PMHx of chronic migraines.   - Recommend MRI of the brain and MRI of the orbits to examine further, patient declines at this time. - Discussed headaches at length, different treatments, can't use Topiramate due to hx of nephrolithiasis.  - Start Propranolol. Common propranolol side effects may include:nausea, vomiting, diarrhea, constipation, stomach cramps; decreased sex drive, impotence, or difficulty having an orgasm;sleep problems (insomnia); or tired feeling. Do NOT get pregnant on this medication. Stop immediately for: slow or uneven heartbeats;a light-headed feeling, like you might pass out;wheezing or trouble breathing;shortness of breath (even with mild exertion), swelling, rapid weight gain;.sudden weakness, vision problems, or loss of coordination (especially in a child with  hemangioma that affects the face or head);cold feeling in your hands and feet;depression, confusion, hallucinations;liver problems - nausea, upper stomach pain, itching, tired feeling, loss of appetite, dark urine, clay-colored stools, jaundice (yellowing of the skin or eyes);low blood sugar - headache, hunger, weakness, sweating, confusion, irritability, dizziness, fast heart rate, or feeling jittery;low blood sugar in a baby - pale skin, blue or purple skin, sweating, fussiness, crying, not wanting to eat, feeling cold, drowsiness, weak or shallow breathing (breathing may stop for short periods), seizure (convulsions), or loss of consciousness; or severe skin reaction - fever, sore throat, swelling in your face or tongue, burning in your eyes, skin pain, followed by a red or purple skin rash that spreads (especially in the face or upper body) and causes blistering and peeling. - Order CMP today - Discussed Botox for migraine   To prevent or relieve headaches, try the following: Cool Compress. Lie down and place a cool compress on your head.  Avoid headache triggers. If certain foods or odors seem to have triggered your migraines in the past, avoid them. A headache diary might help you identify triggers.  Include physical activity in your daily routine. Try a daily walk or other moderate aerobic exercise.  Manage stress. Find healthy ways to cope with the stressors, such as delegating tasks on your to-do list.  Practice relaxation techniques. Try deep breathing, yoga, massage and visualization.  Eat regularly. Eating regularly scheduled meals and maintaining a healthy diet might help prevent headaches. Also, drink plenty of fluids.  Follow a regular sleep schedule. Sleep deprivation might contribute to headaches Consider biofeedback. With this mind-body technique, you learn to control certain bodily functions - such as muscle tension, heart rate and blood pressure - to prevent headaches or reduce  headache pain.    Proceed to emergency room if you experience new or worsening symptoms or symptoms do not resolve, if you have new neurologic symptoms or if headache is severe, or for any concerning symptom.    Naomie DeanAntonia Sharlee Rufino, MD  [facility] [address] [place], [place] [id number]  Phone [phone] Fax [phone]

## 2015-05-15 NOTE — Patient Instructions (Addendum)
Remember to drink plenty of fluid, eat healthy meals and do not skip any meals. Try to eat protein with a every meal and eat a healthy snack such as fruit or nuts in between meals. Try to keep a regular sleep-wake schedule and try to exercise daily, particularly in the form of walking, 20-30 minutes a day, if you can.   As far as your medications are concerned, I would like to suggest; Propranolol  As far as diagnostic testing: Lab today  Our phone number is [phone]. We also have an after hours call service for urgent matters and there is a physician on-call for urgent questions. For any emergencies you know to call 911 or go to the nearest emergency room

## 2015-05-16 ENCOUNTER — Telehealth: Payer: Self-pay | Admitting: *Deleted

## 2015-05-16 ENCOUNTER — Encounter: Payer: Self-pay | Admitting: Neurology

## 2015-05-16 DIAGNOSIS — G43709 Chronic migraine without aura, not intractable, without status migrainosus: Secondary | ICD-10-CM | POA: Insufficient documentation

## 2015-05-16 LAB — COMPREHENSIVE METABOLIC PANEL
ALT: 13 IU/L (ref 0–32)
AST: 17 IU/L (ref 0–40)
Albumin/Globulin Ratio: 1.6 (ref 1.2–2.2)
Albumin: 4.3 g/dL (ref 3.5–5.5)
Alkaline Phosphatase: 41 IU/L (ref 39–117)
BUN/Creatinine Ratio: 20 (ref 9–23)
BUN: 17 mg/dL (ref 6–20)
Bilirubin Total: <0.2 mg/dL (ref 0.0–1.2)
CO2: 23 mmol/L (ref 18–29)
Calcium: 9.9 mg/dL (ref 8.7–10.2)
Chloride: 103 mmol/L (ref 96–106)
Creatinine, Ser: 0.85 mg/dL (ref 0.57–1.00)
GFR calc Af Amer: 100 mL/min/{1.73_m2} (ref >59)
GFR calc non Af Amer: 87 mL/min/{1.73_m2} (ref >59)
Globulin, Total: 2.7 g/dL (ref 1.5–4.5)
Glucose: 90 mg/dL (ref 65–99)
Potassium: 4.4 mmol/L (ref 3.5–5.2)
Sodium: 141 mmol/L (ref 134–144)
Total Protein: 7 g/dL (ref 6.0–8.5)

## 2015-05-16 NOTE — Telephone Encounter (Signed)
LVM informing patient, per Dr Lucia GaskinsAhern, her lab results are normal. Left name, number for any questions.

## 2015-06-06 ENCOUNTER — Ambulatory Visit: Payer: Managed Care, Other (non HMO) | Admitting: Neurology

## 2015-06-06 ENCOUNTER — Encounter: Payer: Self-pay | Admitting: Neurology

## 2015-06-06 ENCOUNTER — Other Ambulatory Visit: Payer: Self-pay | Admitting: Neurology

## 2015-06-06 MED ORDER — SUMATRIPTAN SUCCINATE 100 MG PO TABS: 100.0000 mg | ORAL_TABLET | Freq: Once | ORAL | Status: DC | PRN

## 2015-06-08 DIAGNOSIS — F410 Panic disorder [episodic paroxysmal anxiety] without agoraphobia: Secondary | ICD-10-CM | POA: Diagnosis not present

## 2015-06-08 DIAGNOSIS — F411 Generalized anxiety disorder: Secondary | ICD-10-CM | POA: Diagnosis not present

## 2015-09-03 ENCOUNTER — Ambulatory Visit (INDEPENDENT_AMBULATORY_CARE_PROVIDER_SITE_OTHER): Payer: BLUE CROSS/BLUE SHIELD | Admitting: Neurology

## 2015-09-03 ENCOUNTER — Encounter: Payer: Self-pay | Admitting: Neurology

## 2015-09-03 VITALS — BP 103/77 | HR 65 | Ht 61.0 in | Wt 129.2 lb

## 2015-09-03 DIAGNOSIS — G43009 Migraine without aura, not intractable, without status migrainosus: Secondary | ICD-10-CM

## 2015-09-03 MED ORDER — PROPRANOLOL HCL 10 MG PO TABS: 20.0000 mg | ORAL_TABLET | Freq: Two times a day (BID) | ORAL | 12 refills | Status: DC

## 2015-09-03 NOTE — Progress Notes (Signed)
[facility]    Provider:  Dr Lucia Gaskins Referring Provider: Benita Stabile, MD Primary Care Physician:  Dwana Melena, MD  CC:  Migraines  Interval history: She is improving She has kept a migraine diary. Headaches have decreased, no side effects from propranolol. Discussed options, will increase propranolol and discussed side effects. The imitrex makes her tired, discussed trying other triptans, she did not tolerate the Reglan. She prefers not to try another triptan. Will just change propranolol. Headaches much improved from 15 a month to the following. She has kept a headache diary, discussed her headaches. May: 4 headaches after the 9th June:  4 headaches July: 5 headaches August: 6 headaches.  HPI:  Bridget Parker is a 40 y.o. female here as a referral from Dr. Margo Aye for migraines. Migraines started since the age of 9. Worsening in the last 3-5 years. She has 15 headaches a month. Pressure, throbbing, they start on both sides of the head but can start unilaterally on the left mostly. Typically behind the eyes, forehead, sides, pounding, throbbing. No light sensitivity or sound sensitivity. She has sensitivity to smells such as cleaning products or candles or tobacco. Smells trigger the headaches. +nausea but no vomiting. She has a pressure component to her headache. Headache is positional and worse with bending over. She has woken up with the headaches but they usually develop later in the day. They can last for days and they can be severe enough to keep her out work at 8/10 in pain. They are on average 7/10. They are bad. Significantly affecting her life. No FHx of migraines, mother has allergy headaches. The quality hasn;t changed over the years, but the severity and frequency is worse. Imitrex pill helps, she takes them 10x a month or so. She has tried Zoloft in the past without relief and with xanax and gained weight and she was tired a lot. She recently was prescribed Keppra. No  weight gain recently. She was seen by a headache clinic recently and is here for a second opinion on management. No other focal neurologic symptoms. No vision changes at all, no hearing changing, no neck pain, no meningismus. No aura.   Reviewed notes, labs and imaging from outside physicians, which showed:  Tried: Keppra, Nortriptyline(Did not want to take an antidepressant or gain weight), Imitrex(works), zoloft(side effects). Reglan (made her sick).   Personally reviewed images and agree with the following: MRI of the brain 09/2008 personally reviewed: The brain itself has a normal appearance without evidence of malformation, atrophy, old or acute infarction, mass lesion, hemorrhage, hydrocephalus or extra-axial collection. The pituitary gland is normal. The paranasal sinuses, middle ears and mastoids are clear. There is no anomalous carotid or jugular anatomy. The seventh and eighth nerve complexes appear normal bilaterally. No sign of dural AV fistula. No abnormal contrast enhancement in the region.  IMPRESSION: Normal examination. No cause of pulsatile tinnitus identified.  Reviewed notes from [facility]: Imitrex helps headaches. She tried Nortriptyline but then stopped because she didn't want to be on an anti-depressant. Also did not want to gain weight. She was given Ketoprofen 75mg  prn, Imitrex 100mg  prn and Librium for anxiety. Melatonin at bedtime. 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 Diagnosed with migraine and tension type headache. They discussed acupuncture and cephaly, magnesium, roboflavin and coq10, beta blockers and anti-epilepsy medications used in migraine. He started Keppra 250mg  qhs with titration to 750mg  qhs. Oxaprozen prn.   Review of  Systems: Patient complains of symptoms per HPI as well as the following symptoms: headache, insomnia, sleepiness, kidney stones. Pertinent negatives per HPI. All others negative.   Pertinent negatives per HPI. All others negative.   Social History   Social History  . Marital status: Married    Spouse name: Gerri SporeWesley   . Number of children: 0  . Years of education: some colle   Occupational History  . [employer]    Social History Main Topics  . Smoking status: Never Smoker  . Smokeless tobacco: Not on file  . Alcohol use No  . Drug use: No  . Sexual activity: Yes    Birth control/ protection: Pill   Other Topics Concern  . Not on file   Social History Narrative   Lives with husband   Caffeine use: 1-3 times per week (coffee)    Family History  Problem Relation Age of Onset  . Prostate cancer      grandfather  . Lung cancer      grandfather  . Diabetes      grandfather  . Cancer      grandmother  . Ovarian cancer      Mother's sister (twin)  . Migraines Mother     Past Medical History:  Diagnosis Date  . Anxiety disorder   . GERD (gastroesophageal reflux disease)   . High cholesterol   . Kidney stones   . Migraines   . Palpitations   . S/P endoscopy March 2011   mild gastritis, continue Nexium    Past Surgical History:  Procedure Laterality Date  . KIDNEY STONES     IN HER 20s  . TONSILLECTOMY  1983    Current Outpatient Prescriptions  Medication Sig Dispense Refill  . chlordiazePOXIDE (LIBRIUM) 5 MG capsule Take 5 mg by mouth every morning.    . fluticasone (FLONASE) 50 MCG/ACT nasal spray Place 2 sprays into the nose daily. 16 g 6  . LOESTRIN 24 FE 1-20 MG-MCG TABS     . propranolol (INDERAL) 10 MG tablet Take 1 tablet (10 mg total) by mouth 2 (two) times daily. 60 tablet 12  . SUMAtriptan (IMITREX) 100 MG tablet Take 1 tablet (100 mg total) by mouth once as needed for migraine. May repeat in 2 hours if headache persists or recurs. 10 tablet 12   No current facility-administered medications for this visit.     Allergies as of 09/03/2015 - Review Complete 09/03/2015  Allergen Reaction Noted  . Sulfonamide  derivatives      Vitals: BP 103/77   Pulse 65   Ht 5\' 1"  (1.549 m)   Wt 129 lb 3.2 oz (58.6 kg)   BMI 24.41 kg/m  Last Weight:  Wt Readings from Last 1 Encounters:  09/03/15 129 lb 3.2 oz (58.6 kg)   Last Height:   Ht Readings from Last 1 Encounters:  09/03/15 5\' 1"  (1.549 m)     Exam: Gen: NAD, conversant                    CV: RRR, no MRG. No Carotid Bruits. No peripheral edema, warm, nontender Eyes: Conjunctivae clear without exudates or hemorrhage  Neuro: Detailed Neurologic Exam  Speech:    Speech is normal; fluent and spontaneous with normal comprehension.  Cognition:    The patient is oriented to person, place, and time;     recent and remote memory intact;     language fluent;     normal attention, concentration,  fund of knowledge Cranial Nerves:    The pupils are equal, round, and reactive to light. The fundi are normal and spontaneous venous pulsations are present. Visual fields are full to finger confrontation. Extraocular movements are intact. Trigeminal sensation is intact and the muscles of mastication are normal. The face is symmetric. The palate elevates in the midline. Hearing intact. Voice is normal. Shoulder shrug is normal. The tongue has normal motion without fasciculations.   Coordination:    Normal finger to nose and heel to shin. Normal rapid alternating movements.   Gait:    Heel-toe and tandem gait are normal.   Motor Observation:    No asymmetry, no atrophy, and no involuntary movements noted. Tone:    Normal muscle tone.    Posture:    Posture is normal. normal erect    Strength:    Strength is V/V in the upper and lower limbs.      Sensation: intact to LT     Reflex Exam:  DTR's:    Deep tendon reflexes in the upper and lower extremities are normal bilaterally.   Toes:    The toes are downgoing bilaterally.   Clonus:    Clonus is absent.       Assessment/Plan:  40 year old with a PMHx of chronic  migraines.   - Recommend MRI of the brain and MRI of the orbits to examine further, patient declines at this time. - Discussed headaches at length, different treatments, can't use Topiramate due to hx of nephrolithiasis.  - Increase Propranolol. Common propranolol side effects may include:nausea, vomiting, diarrhea, constipation, stomach cramps; decreased sex drive, impotence, or difficulty having an orgasm;sleep problems (insomnia); or tired feeling. Do NOT get pregnant on this medication. Stop immediately for: slow or uneven heartbeats;a light-headed feeling, like you might pass out;wheezing or trouble breathing;shortness of breath (even with mild exertion), swelling, rapid weight gain;.sudden weakness, vision problems, or loss of coordination (especially in a child with hemangioma that affects the face or head);cold feeling in your hands and feet;depression, confusion, hallucinations;liver problems - nausea, upper stomach pain, itching, tired feeling, loss of appetite, dark urine, clay-colored stools, jaundice (yellowing of the skin or eyes);low blood sugar - headache, hunger, weakness, sweating, confusion, irritability, dizziness, fast heart rate, or feeling jittery;low blood sugar in a baby - pale skin, blue or purple skin, sweating, fussiness, crying, not wanting to eat, feeling cold, drowsiness, weak or shallow breathing (breathing may stop for short periods), seizure (convulsions), or loss of consciousness; or severe skin reaction - fever, sore throat, swelling in your face or tongue, burning in your eyes, skin pain, followed by a red or purple skin rash that spreads (especially in the face or upper body) and causes blistering and peeling. - Discussed Botox for migraine - Continue Imitrex for acute management   Discussed: To prevent or relieve headaches, try the following:  Cool Compress. Lie down and place a cool compress on your head.   Avoid headache triggers. If certain foods or odors  seem to have triggered your migraines in the past, avoid them. A headache diary might help you identify triggers.   Include physical activity in your daily routine. Try a daily walk or other moderate aerobic exercise.   Manage stress. Find healthy ways to cope with the stressors, such as delegating tasks on your to-do list.   Practice relaxation techniques. Try deep breathing, yoga, massage and visualization.   Eat regularly. Eating regularly scheduled meals and maintaining a healthy diet might  help prevent headaches. Also, drink plenty of fluids.   Follow a regular sleep schedule. Sleep deprivation might contribute to headaches  Consider biofeedback. With this mind-body technique, you learn to control certain bodily functions - such as muscle tension, heart rate and blood pressure - to prevent headaches or reduce headache pain.    Proceed to emergency room if you experience new or worsening symptoms or symptoms do not resolve, if you have new neurologic symptoms or if headache is severe, or for any concerning symptom.   Naomie Dean, MD  [facility] [address] [place], [place] [address]  Phone [phone] Fax [phone]  A total of 30 minutes was spent face-to-face with this patient. Over half this time was spent on counseling patient on the migraine diagnosis and different diagnostic and therapeutic options available.

## 2015-09-03 NOTE — Patient Instructions (Signed)
Remember to drink plenty of fluid, eat healthy meals and do not skip any meals. Try to eat protein with a every meal and eat a healthy snack such as fruit or nuts in between meals. Try to keep a regular sleep-wake schedule and try to exercise daily, particularly in the form of walking, 20-30 minutes a day, if you can.   As far as your medications are concerned, I would like to suggest: Increase Propranolol to 20mg  twice daily  I would like to see you back in 4-6 months, sooner if we need to. Please call us with any interim questions, concerns, problems, updates or refill requests.   Our phone number is [phone]. We also have an after hours call service for urgent matters and there is a physician on-call for urgent questions. For any emergencies you know to call 911 or go to the nearest emergency room

## 2015-11-23 DIAGNOSIS — G43909 Migraine, unspecified, not intractable, without status migrainosus: Secondary | ICD-10-CM | POA: Diagnosis not present

## 2015-11-23 DIAGNOSIS — Z6823 Body mass index (BMI) 23.0-23.9, adult: Secondary | ICD-10-CM | POA: Diagnosis not present

## 2015-11-23 DIAGNOSIS — Z01419 Encounter for gynecological examination (general) (routine) without abnormal findings: Secondary | ICD-10-CM | POA: Diagnosis not present

## 2015-11-23 DIAGNOSIS — F411 Generalized anxiety disorder: Secondary | ICD-10-CM | POA: Diagnosis not present

## 2015-11-23 DIAGNOSIS — F410 Panic disorder [episodic paroxysmal anxiety] without agoraphobia: Secondary | ICD-10-CM | POA: Diagnosis not present

## 2015-11-23 DIAGNOSIS — Z1151 Encounter for screening for human papillomavirus (HPV): Secondary | ICD-10-CM | POA: Diagnosis not present

## 2016-02-14 ENCOUNTER — Ambulatory Visit: Payer: BLUE CROSS/BLUE SHIELD | Admitting: Neurology

## 2016-03-13 ENCOUNTER — Ambulatory Visit (INDEPENDENT_AMBULATORY_CARE_PROVIDER_SITE_OTHER): Payer: PRIVATE HEALTH INSURANCE | Admitting: Neurology

## 2016-03-13 ENCOUNTER — Encounter: Payer: Self-pay | Admitting: Neurology

## 2016-03-13 VITALS — Ht 61.0 in | Wt 131.4 lb

## 2016-03-13 DIAGNOSIS — G43009 Migraine without aura, not intractable, without status migrainosus: Secondary | ICD-10-CM

## 2016-03-13 MED ORDER — SUMATRIPTAN SUCCINATE 100 MG PO TABS: 100.0000 mg | ORAL_TABLET | ORAL | 12 refills | Status: DC | PRN

## 2016-03-13 NOTE — Progress Notes (Signed)
[facility]    Provider:  Dr Lucia GaskinsAhern Referring Provider: Benita StabileHall, John Z, MD Primary Care Physician:  Dwana MelenaZack Hall, MD  CC: Migraines  Interval history: Some months she can go without a migraine. Jan and Feb she had none. This month she had a severe 4-day migraine with a heavy menses. Imitrex would make it go away and then she would have the headache the next day and have to take another imitrex. She stopped the propranolol and still the headaches have improved. Can't take Topiramate due to hx of kidney stones  Tried: Keppra, Nortriptyline(Did not want to take an antidepressant or gain weight), Imitrex(works), zoloft(side effects). Reglan (made her sick).   Interval history: She is improving She has kept a migraine diary. Headaches have decreased, no side effects from propranolol. Discussed options, will increase propranolol and discussed side effects. The imitrex makes her tired, discussed trying other triptans, she did not tolerate the Reglan. She prefers not to try another triptan. Will just change propranolol. Headaches much improved from 15 a month to the following. She has kept a headache diary, discussed her headaches. May: 4 headaches after the 9th June:  4 headaches July: 5 headaches August: 6 headaches.  VWU:JWJXBJHPI:Bridget L Priceis a 41 y.o.femalehere as a referral from Dr. Darlin CocoHallfor migraines. Migraines started since the age of 41. Worsening in the last 3-5 years. She has 15 headaches a month. Pressure, throbbing, they start on both sides of the head but can start unilaterally on the left mostly. Typically behind the eyes, forehead, sides, pounding, throbbing. No light sensitivity or sound sensitivity. She has sensitivity to smells such as cleaning products or candles or tobacco. Smells trigger the headaches. +nausea but no vomiting. She has a pressure component to her headache. Headache is positional and worse with bending over. She has woken up with the headaches but they  usually develop later in the day. They can last for days and they can be severe enough to keep her out work at 8/10 in pain. They are on average 7/10. They are bad. Significantly affecting her life. No FHx of migraines, mother has allergy headaches. The quality hasn;t changed over the years, but the severity and frequency is worse. Imitrex pill helps, she takes them 10x a month or so. She has tried Zoloft in the past without relief and with xanax and gained weight and she was tired a lot. She recently was prescribed Keppra. No weight gain recently. She was seen by a headache clinic recently and is here for a second opinion on management. No other focal neurologic symptoms. No vision changes at all, no hearing changing, no neck pain, no meningismus. No aura.   Reviewed notes, labs and imaging from outside physicians, which showed:  Tried: Keppra, Nortriptyline(Did not want to take an antidepressant or gain weight), Imitrex(works), zoloft(side effects). Reglan (made her sick).   Personally reviewed images and agree with the following: MRI of the brain 09/2008 personally reviewed: The brain itself has a normal appearance without evidence of malformation, atrophy, old or acute infarction, mass lesion, hemorrhage, hydrocephalus or extra-axial collection. The pituitary gland is normal. The paranasal sinuses, middle ears and mastoids are clear. There is no anomalous carotid or jugular anatomy. The seventh and eighth nerve complexes appear normal bilaterally. No sign of dural AV fistula. No abnormal contrast enhancement in the region.  IMPRESSION: Normal examination. No cause of pulsatile tinnitus identified.  Reviewed notes from [facility]: Imitrex helps headaches. She tried Nortriptyline but  then stopped because she didn't want to be on an anti-depressant. Also did not want to gain weight. She was given Ketoprofen 75mg  prn, Imitrex 100mg  prn and Librium for anxiety.  Melatonin at bedtime. 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 Diagnosed with migraine and tension type headache. They discussed acupuncture and cephaly, magnesium, roboflavin and coq10, beta blockers and anti-epilepsy medications used in migraine. He started Keppra 250mg  qhs with titration to 750mg  qhs. Oxaprozen prn.   Review of Systems: Patient complains of symptoms per HPI as well as the following symptoms: headache, insomnia, sleepiness, kidney stones. Pertinent negatives per HPI. All others negative.  Pertinent negatives per HPI. All others negative.  Social History   Social History  . Marital status: Married    Spouse name: Gerri Spore   . Number of children: 0  . Years of education: some colle   Occupational History  . [employer]    Social History Main Topics  . Smoking status: Never Smoker  . Smokeless tobacco: Never Used  . Alcohol use No  . Drug use: No  . Sexual activity: Yes    Birth control/ protection: Pill   Other Topics Concern  . Not on file   Social History Narrative   Lives with husband   Caffeine use: 1-3 times per week (coffee)   Right-handed    Family History  Problem Relation Age of Onset  . Migraines Mother   . Prostate cancer      grandfather  . Lung cancer      grandfather  . Diabetes      grandfather  . Cancer      grandmother  . Ovarian cancer      Mother's sister (twin)    Past Medical History:  Diagnosis Date  . Anxiety disorder   . GERD (gastroesophageal reflux disease)   . High cholesterol   . Kidney stones   . Migraines   . Palpitations   . S/P endoscopy March 2011   mild gastritis, continue Nexium    Past Surgical History:  Procedure Laterality Date  . KIDNEY STONES     IN HER 20s  . TONSILLECTOMY  1983    Current Outpatient Prescriptions  Medication Sig Dispense Refill  . chlordiazePOXIDE (LIBRIUM) 5 MG capsule Take 5 mg by mouth every morning.    . diphenhydrAMINE (BENADRYL) 25 MG  tablet Take 25 mg by mouth every 8 (eight) hours as needed.    . fluticasone (FLONASE) 50 MCG/ACT nasal spray Place 2 sprays into the nose daily. 16 g 6  . LOESTRIN 24 FE 1-20 MG-MCG TABS     . loratadine (CLARITIN) 10 MG tablet Take 10 mg by mouth daily.    . Melatonin 5 MG TABS Take 10 mg by mouth at bedtime.    . SUMAtriptan (IMITREX) 100 MG tablet Take 1 tablet (100 mg total) by mouth every 2 (two) hours as needed for migraine. May repeat in 2 hours if headache persists or recurs. 10 tablet 12   No current facility-administered medications for this visit.     Allergies as of 03/13/2016 - Review Complete 03/13/2016  Allergen Reaction Noted  . Sulfonamide derivatives      Vitals: Ht 5\' 1"  (1.549 m)   Wt 131 lb 6.4 oz (59.6 kg)   BMI 24.83 kg/m  Last Weight:  Wt Readings from Last 1 Encounters:  03/13/16 131 lb 6.4 oz (59.6 kg)   Last Height:   Ht Readings from Last 1  Encounters:  03/13/16 5\' 1"  (1.549 m)     Exam: Gen: NAD, conversant  CV: RRR, no MRG. No Carotid Bruits. No peripheral edema, warm, nontender Eyes: Conjunctivae clear without exudates or hemorrhage  Neuro: Detailed Neurologic Exam  Speech: Speech is normal; fluent and spontaneous with normal comprehension.  Cognition: The patient is oriented to person, place, and time;  recent and remote memory intact;  language fluent;  normal attention, concentration,  fund of knowledge Cranial Nerves: The pupils are equal, round, and reactive to light. The fundi are normal and spontaneous venous pulsations are present. Visual fields are full to finger confrontation. Extraocular movements are intact. Trigeminal sensation is intact and the muscles of mastication are normal. The face is symmetric. The palate elevates in the midline. Hearing intact. Voice is normal. Shoulder shrug is normal. The tongue has normal motion without fasciculations.   Coordination: Normal finger  to nose and heel to shin. Normal rapid alternating movements.   Gait: Heel-toe and tandem gait are normal.   Motor Observation: No asymmetry, no atrophy, and no involuntary movements noted. Tone: Normal muscle tone.   Posture: Posture is normal. normal erect  Strength: Strength is V/V in the upper and lower limbs.   Sensation: intact to LT  Reflex Exam:  DTR's: Deep tendon reflexes in the upper and lower extremities are normal bilaterally.  Toes: The toes are downgoing bilaterally.  Clonus: Clonus is absent.      Assessment/Plan:41 year old with a PMHx of chronic migraines which have improved with decrease in life stress.  At this time will continue acute management with imitrex and not preventative since patient's symptoms have improved.   Discussed: To prevent or relieve headaches, try the following: Cool Compress. Lie down and place a cool compress on your head.  Avoid headache triggers. If certain foods or odors seem to have triggered your migraines in the past, avoid them. A headache diary might help you identify triggers.  Include physical activity in your daily routine. Try a daily walk or other moderate aerobic exercise.  Manage stress. Find healthy ways to cope with the stressors, such as delegating tasks on your to-do list.  Practice relaxation techniques. Try deep breathing, yoga, massage and visualization.  Eat regularly. Eating regularly scheduled meals and maintaining a healthy diet might help prevent headaches. Also, drink plenty of fluids.  Follow a regular sleep schedule. Sleep deprivation might contribute to headaches Consider biofeedback. With this mind-body technique, you learn to control certain bodily functions - such as muscle tension, heart rate and blood pressure - to prevent headaches or reduce headache pain.    Proceed to emergency room if you experience new or worsening symptoms or symptoms do not  resolve, if you have new neurologic symptoms or if headache is severe, or for any concerning symptom.     Naomie Dean, MD  [facility] [address] [place], [place] [address]  Phone [phone] Fax [phone]  A total of 15 minutes was spent face-to-face with this patient. Over half this time was spent on counseling patient on the migraine diagnosis and different diagnostic and therapeutic options available

## 2016-03-13 NOTE — Patient Instructions (Signed)
Remember to drink plenty of fluid, eat healthy meals and do not skip any meals. Try to eat protein with a every meal and eat a healthy snack such as fruit or nuts in between meals. Try to keep a regular sleep-wake schedule and try to exercise daily, particularly in the form of walking, 20-30 minutes a day, if you can.   As far as your medications are concerned, I would like to suggest: Continue current medications  I would like to see you back in as needed, sooner if we need to. Please call us with any interim questions, concerns, problems, updates or refill requests.   Our phone number is [phone]. We also have an after hours call service for urgent matters and there is a physician on-call for urgent questions. For any emergencies you know to call 911 or go to the nearest emergency room   

## 2017-01-13 DIAGNOSIS — J209 Acute bronchitis, unspecified: Secondary | ICD-10-CM | POA: Diagnosis not present

## 2017-01-27 ENCOUNTER — Telehealth: Payer: Self-pay | Admitting: Nurse Practitioner

## 2017-01-27 ENCOUNTER — Encounter: Payer: Self-pay | Admitting: Neurology

## 2017-01-27 ENCOUNTER — Encounter: Payer: Self-pay | Admitting: Nurse Practitioner

## 2017-01-27 ENCOUNTER — Encounter: Payer: Self-pay | Admitting: *Deleted

## 2017-01-27 NOTE — Telephone Encounter (Signed)
----   Message from Mychart, Generic sent at 01/27/2017 10:31 AM EST -----    Appointment Request From: Bridget PriceKellie L Parker    With Provider: Anson FretAhern, Antonia B, MD [[facility]]    Preferred Date Range: 02/19/2017 - 03/10/2017    Preferred Times: Tuesday Afternoon, Thursday Afternoon    Reason for visit: Request an Appointment    Comments:  On 02/20/16 and 03/10/16 I am off work those days, I prefer afternoon appointments.  Thank you!  Bridget Parker DOB January 26, 1975

## 2017-01-27 NOTE — Telephone Encounter (Signed)
Called and LVM asking for call back to get patient scheduled.

## 2017-01-27 NOTE — Telephone Encounter (Signed)
Attempted to call patient. LVM asking for call back to schedule an appt.

## 2017-01-27 NOTE — Telephone Encounter (Signed)
Pt is being seen for migrainesand medication f/u, she is Dr Lucia GaskinsAhern pt, [facility]  FYI  See email 01/27/17

## 2017-01-27 NOTE — Telephone Encounter (Signed)
Scheduled pt through email encounter.

## 2017-01-27 NOTE — Telephone Encounter (Signed)
See other note. Patient has specific appt dates/times requested.

## 2017-01-29 ENCOUNTER — Ambulatory Visit: Payer: PRIVATE HEALTH INSURANCE | Admitting: Nurse Practitioner

## 2017-01-30 DIAGNOSIS — Z1231 Encounter for screening mammogram for malignant neoplasm of breast: Secondary | ICD-10-CM | POA: Diagnosis not present

## 2017-01-30 DIAGNOSIS — Z131 Encounter for screening for diabetes mellitus: Secondary | ICD-10-CM | POA: Diagnosis not present

## 2017-01-30 DIAGNOSIS — Z0000 Encounter for general adult medical examination without abnormal findings: Secondary | ICD-10-CM | POA: Diagnosis not present

## 2017-01-30 DIAGNOSIS — Z1329 Encounter for screening for other suspected endocrine disorder: Secondary | ICD-10-CM | POA: Diagnosis not present

## 2017-01-30 DIAGNOSIS — Z13220 Encounter for screening for lipoid disorders: Secondary | ICD-10-CM | POA: Diagnosis not present

## 2017-01-30 DIAGNOSIS — Z130 Encounter for screening for diseases of the blood and blood-forming organs and certain disorders involving the immune mechanism: Secondary | ICD-10-CM | POA: Diagnosis not present

## 2017-01-30 DIAGNOSIS — Z6824 Body mass index (BMI) 24.0-24.9, adult: Secondary | ICD-10-CM | POA: Diagnosis not present

## 2017-01-30 DIAGNOSIS — Z01419 Encounter for gynecological examination (general) (routine) without abnormal findings: Secondary | ICD-10-CM | POA: Diagnosis not present

## 2017-02-19 ENCOUNTER — Ambulatory Visit: Payer: Self-pay | Admitting: Neurology

## 2017-03-10 ENCOUNTER — Ambulatory Visit: Payer: PRIVATE HEALTH INSURANCE | Admitting: Neurology

## 2017-09-17 DIAGNOSIS — Z6823 Body mass index (BMI) 23.0-23.9, adult: Secondary | ICD-10-CM | POA: Diagnosis not present

## 2017-09-17 DIAGNOSIS — J019 Acute sinusitis, unspecified: Secondary | ICD-10-CM | POA: Diagnosis not present

## 2017-09-17 DIAGNOSIS — J3489 Other specified disorders of nose and nasal sinuses: Secondary | ICD-10-CM | POA: Diagnosis not present

## 2017-11-23 DIAGNOSIS — J019 Acute sinusitis, unspecified: Secondary | ICD-10-CM | POA: Diagnosis not present

## 2017-12-11 ENCOUNTER — Encounter: Payer: Self-pay | Admitting: Certified Nurse Midwife

## 2017-12-25 ENCOUNTER — Other Ambulatory Visit (HOSPITAL_COMMUNITY)
Admission: RE | Admit: 2017-12-25 | Discharge: 2017-12-25 | Disposition: A | Discharge status: Home / Self Care | Payer: BLUE CROSS/BLUE SHIELD | Source: Ambulatory Visit | Attending: Certified Nurse Midwife | Admitting: Certified Nurse Midwife

## 2017-12-25 ENCOUNTER — Encounter: Payer: Self-pay | Admitting: Certified Nurse Midwife

## 2017-12-25 ENCOUNTER — Other Ambulatory Visit: Payer: Self-pay

## 2017-12-25 ENCOUNTER — Ambulatory Visit: Payer: BLUE CROSS/BLUE SHIELD | Admitting: Certified Nurse Midwife

## 2017-12-25 VITALS — BP 118/72 | HR 70 | Resp 16 | Ht 61.25 in | Wt 126.0 lb

## 2017-12-25 DIAGNOSIS — Z8041 Family history of malignant neoplasm of ovary: Secondary | ICD-10-CM

## 2017-12-25 DIAGNOSIS — Z01419 Encounter for gynecological examination (general) (routine) without abnormal findings: Secondary | ICD-10-CM | POA: Diagnosis not present

## 2017-12-25 DIAGNOSIS — Z124 Encounter for screening for malignant neoplasm of cervix: Secondary | ICD-10-CM

## 2017-12-25 DIAGNOSIS — Z30011 Encounter for initial prescription of contraceptive pills: Secondary | ICD-10-CM

## 2017-12-25 DIAGNOSIS — E559 Vitamin D deficiency, unspecified: Secondary | ICD-10-CM

## 2017-12-25 DIAGNOSIS — Z0000 Encounter for general adult medical examination without abnormal findings: Secondary | ICD-10-CM

## 2017-12-25 MED ORDER — NORETHIN ACE-ETH ESTRAD-FE 1-20 MG-MCG(24) PO TABS: 1.0000 | ORAL_TABLET | Freq: Every day | ORAL | 12 refills | Status: DC

## 2017-12-25 NOTE — Patient Instructions (Signed)

## 2017-12-25 NOTE — Progress Notes (Signed)
42 y.o. G0P0000 Married  Caucasian Fe here to establish gyn care and  for annual exam. Contraception OCP since 42 years old. No issues with. Periods normal monthly and light with some cramping. Uses OTC medication if needed for cramping. Does have menstrual migraines, no aura ever. Sees Catalina PizzaZach Hall with PCP for sinus management, prn. Sees Dr. Lucia GaskinsAhern for migraine management prn. Has Imitrex for use as needed.Sees Dr. Evelene CroonKaur for panic attack management twice yearly with Librium use. No other health concerns today. Would like screening labs today.  Patient's last menstrual period was 12/10/2017 (exact date).          Sexually active: Yes.    The current method of family planning is OCP (estrogen/progesterone).    Exercising: No.  exercise Smoker:  no  Review of Systems  Constitutional: Negative.   HENT: Negative.   Eyes: Negative.   Respiratory: Negative.   Cardiovascular: Negative.   Gastrointestinal: Negative.   Genitourinary: Negative.   Musculoskeletal: Negative.   Skin: Negative.   Neurological: Negative.   Endo/Heme/Allergies: Negative.   Psychiatric/Behavioral: Negative.     Health Maintenance: Pap:  A yr ago neg per patient History of Abnormal Pap: no MMG:  01/2017 neg per patient Self Breast exam: no Colonoscopy:  none BMD:   none TDaP:  UTD Shingles: no Pneumonia: no Hep C and HIV: not done Labs: yes   reports that she has never smoked. She has never used smokeless tobacco. She reports that she does not drink alcohol or use drugs.  Past Medical History:  Diagnosis Date  . Anxiety disorder   . GERD (gastroesophageal reflux disease)   . High cholesterol   . Kidney stones   . Migraines   . Palpitations   . S/P endoscopy March 2011   mild gastritis, continue Nexium    Past Surgical History:  Procedure Laterality Date  . KIDNEY STONES     IN HER 20s stint  . TONSILLECTOMY  1983    Current Outpatient Medications  Medication Sig Dispense Refill  . chlordiazePOXIDE  (LIBRIUM) 5 MG capsule Take 5 mg by mouth every morning.    . fluticasone (FLONASE) 50 MCG/ACT nasal spray Place 2 sprays into the nose daily. 16 g 6  . LOESTRIN 24 FE 1-20 MG-MCG TABS     . loratadine (CLARITIN) 10 MG tablet Take 10 mg by mouth daily.    . Melatonin 5 MG TABS Take 10 mg by mouth at bedtime.    . Multiple Vitamins-Minerals (MULTIVITAMIN PO) Take by mouth.    . SUMAtriptan (IMITREX) 100 MG tablet Take 1 tablet (100 mg total) by mouth every 2 (two) hours as needed for migraine. May repeat in 2 hours if headache persists or recurs. 10 tablet 12   No current facility-administered medications for this visit.     Family History  Problem Relation Age of Onset  . Cancer Maternal Grandmother   . Prostate cancer Maternal Grandfather   . Colon cancer Maternal Grandfather   . Lung cancer Paternal Grandmother   . Diabetes Paternal Grandfather   . Ovarian cancer Maternal Aunt        moms identical twin    ROS:  Pertinent items are noted in HPI.  Otherwise, a comprehensive ROS was negative.  Exam:   BP 118/72   Pulse 70   Resp 16   Ht 5' 1.25" (1.556 m)   Wt 126 lb (57.2 kg)   LMP 12/10/2017 (Exact Date)   BMI 23.61 kg/m  Height:  5' 1.25" (155.6 cm) Ht Readings from Last 3 Encounters:  12/25/17 5' 1.25" (1.556 m)  03/13/16 5\' 1"  (1.549 m)  09/03/15 5\' 1"  (1.549 m)    General appearance: alert, cooperative and appears stated age Head: Normocephalic, without obvious abnormality, atraumatic Neck: no adenopathy, supple, symmetrical, trachea midline and thyroid normal to inspection and palpation Lungs: clear to auscultation bilaterally Breasts: normal appearance, no masses or tenderness, No nipple retraction or dimpling, No nipple discharge or bleeding, No axillary or supraclavicular adenopathy Heart: regular rate and rhythm Abdomen: soft, non-tender; no masses,  no organomegaly Extremities: extremities normal, atraumatic, no cyanosis or edema Skin: Skin color, texture,  turgor normal. No rashes or lesions Lymph nodes: Cervical, supraclavicular, and axillary nodes normal. No abnormal inguinal nodes palpated Neurologic: Grossly normal   Pelvic: External genitalia:  no lesions              Urethra:  normal appearing urethra with no masses, tenderness or lesions              Bartholin's and Skene's: normal                 Vagina: normal appearing vagina with normal color and discharge, no lesions              Cervix: no cervical motion tenderness, no lesions and nulliparous appearance              Pap taken: Yes.   Bimanual Exam:  Uterus:  normal size, contour, position, consistency, mobility, non-tender and anteverted              Adnexa: normal adnexa and no mass, fullness, tenderness               Rectovaginal: Confirms               Anus:  normal sphincter tone, no lesions  Chaperone present: yes  A:  Well Woman with normal exam  Contraception OCP desired  History of migraine no aura with Neurology management  History of panic attacks with Psychiatry management  Mammogram due 01/2018 patient to schedule with [facility] will call if issues with  Family history of ovarian cancer( mother's identical twin)  Screening labs  P:   Reviewed health and wellness pertinent to exam  Risks/benefits/warning signs with OCP reviewed, request RX  Rx Loestrin 24 Fe see order with instructions  Warning of migraine headache given and continue follow up with Neurology  Continue follow up with psychiatry as indicated.  Discussed no routine screening for Ovarian cancer except for PUS and CA 125 and limitations discussed. Discussed genetic consultation recommendation, patient will consider discussion and advise if she desires screening or referral to genetic.  Labs: CMP, Lipid panel, Vitamin D TSH,CBC  Pap smear: yes   counseled on breast self exam, mammography screening, use and side effects of OCP's, adequate intake of calcium and vitamin D, diet and exercise  return  annually or prn  An After Visit Summary was printed and given to the patient.

## 2017-12-26 LAB — COMPREHENSIVE METABOLIC PANEL
ALT: 13 IU/L (ref 0–32)
AST: 18 IU/L (ref 0–40)
Albumin/Globulin Ratio: 1.7 (ref 1.2–2.2)
Albumin: 4.3 g/dL (ref 3.5–5.5)
Alkaline Phosphatase: 46 IU/L (ref 39–117)
BUN/Creatinine Ratio: 22 (ref 9–23)
BUN: 17 mg/dL (ref 6–24)
Bilirubin Total: <0.2 mg/dL (ref 0.0–1.2)
CO2: 22 mmol/L (ref 20–29)
Calcium: 9.9 mg/dL (ref 8.7–10.2)
Chloride: 104 mmol/L (ref 96–106)
Creatinine, Ser: 0.78 mg/dL (ref 0.57–1.00)
GFR calc Af Amer: 109 mL/min/{1.73_m2} (ref >59)
GFR calc non Af Amer: 95 mL/min/{1.73_m2} (ref >59)
Globulin, Total: 2.6 g/dL (ref 1.5–4.5)
Glucose: 90 mg/dL (ref 65–99)
Potassium: 4.3 mmol/L (ref 3.5–5.2)
Sodium: 143 mmol/L (ref 134–144)
Total Protein: 6.9 g/dL (ref 6.0–8.5)

## 2017-12-26 LAB — LIPID PANEL
Chol/HDL Ratio: 3.4 ratio (ref 0.0–4.4)
Cholesterol, Total: 196 mg/dL (ref 100–199)
HDL: 57 mg/dL (ref >39)
LDL Calculated: 108 mg/dL — ABNORMAL HIGH (ref 0–99)
Triglycerides: 153 mg/dL — ABNORMAL HIGH (ref 0–149)
VLDL Cholesterol Cal: 31 mg/dL (ref 5–40)

## 2017-12-26 LAB — CBC
Hematocrit: 42.7 % (ref 34.0–46.6)
Hemoglobin: 14.2 g/dL (ref 11.1–15.9)
MCH: 30.9 pg (ref 26.6–33.0)
MCHC: 33.3 g/dL (ref 31.5–35.7)
MCV: 93 fL (ref 79–97)
Platelets: 365 10*3/uL (ref 150–450)
RBC: 4.59 x10E6/uL (ref 3.77–5.28)
RDW: 11.8 % — ABNORMAL LOW (ref 12.3–15.4)
WBC: 8.4 10*3/uL (ref 3.4–10.8)

## 2017-12-26 LAB — VITAMIN D 25 HYDROXY (VIT D DEFICIENCY, FRACTURES): Vit D, 25-Hydroxy: 34.8 ng/mL (ref 30.0–100.0)

## 2017-12-26 LAB — TSH: TSH: 2.15 u[IU]/mL (ref 0.450–4.500)

## 2017-12-28 ENCOUNTER — Telehealth: Payer: Self-pay | Admitting: Certified Nurse Midwife

## 2017-12-28 LAB — CYTOLOGY - PAP
Diagnosis: NEGATIVE
HPV: NOT DETECTED

## 2017-12-28 NOTE — Telephone Encounter (Signed)
Patient called and said she is returning a call with questions about her lab results.

## 2017-12-28 NOTE — Telephone Encounter (Signed)
-----   Message from Verner Choleborah S Leonard, CNM sent at 12/28/2017  8:39 AM EST ----- Notify patient her Liver, kidney, glucose screen is normal Lipid panel shows normal cholesterol at 196 normal <199 Triglycerides are just borderline elevated at 153 normal <149 HDL is normal at 57 >greater than is normal LDL is borderline elevate at 108 normal <99 Keep working on good diet with colorful fresh vegetables, fiber and lean meat, regular exercise TSH is normal Vitamin D is 34.8 CBC is essentially normal no anemia Pap pending

## 2017-12-28 NOTE — Telephone Encounter (Signed)
Spoke with patient, advised as seen below per Deborah Leonard, CNM. Patient verbalizes understanding and is agreeable.   Encounter closed.  

## 2018-01-01 DIAGNOSIS — F410 Panic disorder [episodic paroxysmal anxiety] without agoraphobia: Secondary | ICD-10-CM | POA: Diagnosis not present

## 2018-02-08 DIAGNOSIS — J060 Acute laryngopharyngitis: Secondary | ICD-10-CM | POA: Diagnosis not present

## 2018-03-04 ENCOUNTER — Ambulatory Visit (HOSPITAL_COMMUNITY)
Admission: RE | Admit: 2018-03-04 | Discharge: 2018-03-04 | Disposition: A | Discharge status: Home / Self Care | Payer: BLUE CROSS/BLUE SHIELD | Source: Ambulatory Visit | Attending: Certified Nurse Midwife | Admitting: Certified Nurse Midwife

## 2018-03-04 ENCOUNTER — Other Ambulatory Visit (HOSPITAL_COMMUNITY): Payer: Self-pay | Admitting: Certified Nurse Midwife

## 2018-03-04 DIAGNOSIS — Z1231 Encounter for screening mammogram for malignant neoplasm of breast: Secondary | ICD-10-CM

## 2018-03-08 DIAGNOSIS — J040 Acute laryngitis: Secondary | ICD-10-CM | POA: Diagnosis not present

## 2018-06-02 ENCOUNTER — Other Ambulatory Visit: Payer: Self-pay | Admitting: Neurology

## 2018-06-03 ENCOUNTER — Telehealth: Payer: Self-pay | Admitting: Neurology

## 2018-06-03 NOTE — Telephone Encounter (Signed)
5-28 Pt called and gave verbal consent to file insurance for a doxy.me vv Email confirmed as:[email or web address]  Pt understands that although there may be some limitations with this type of visit, we will take all precautions to reduce any security or privacy concerns.  Pt understands that this will be treated like an in office visit and we will file with pt's insurance, and there may be a patient responsible charge related to this service.

## 2018-06-03 NOTE — Telephone Encounter (Signed)
Noted  

## 2018-06-30 ENCOUNTER — Encounter: Payer: Self-pay | Admitting: Neurology

## 2018-06-30 NOTE — Addendum Note (Signed)
Addended by: Gildardo Griffes on: 06/30/2018 01:37 PM   Modules accepted: Orders

## 2018-06-30 NOTE — Telephone Encounter (Signed)
Pt returned my call. She confirmed her name & DOB and provided updates to her chart. She confirmed she received the link via email. She understands the plan for tomorrow. She verbalized appreciation for the call.

## 2018-06-30 NOTE — Telephone Encounter (Signed)
Called pt & LVM (ok per DPR) asking for call back to update chart before her VV with Dr. Jaynee Eagles tomorrow 6/25 at 3 pm. Left office number in message.

## 2018-07-01 ENCOUNTER — Ambulatory Visit (INDEPENDENT_AMBULATORY_CARE_PROVIDER_SITE_OTHER): Payer: BC Managed Care – PPO | Admitting: Neurology

## 2018-07-01 ENCOUNTER — Other Ambulatory Visit: Payer: Self-pay

## 2018-07-01 DIAGNOSIS — G43709 Chronic migraine without aura, not intractable, without status migrainosus: Secondary | ICD-10-CM | POA: Diagnosis not present

## 2018-07-01 MED ORDER — SUMATRIPTAN SUCCINATE 100 MG PO TABS: 100.0000 mg | ORAL_TABLET | ORAL | 12 refills | Status: DC | PRN

## 2018-07-01 MED ORDER — AJOVY 225 MG/1.5ML ~~LOC~~ SOAJ: 225.0000 mg | SUBCUTANEOUS | 11 refills | Status: DC

## 2018-07-01 NOTE — Progress Notes (Signed)
[facility]    Provider:  Dr Lucia GaskinsAhern Referring Provider: Benita StabileHall, John Z, MD Primary Care Physician:  Benita StabileHall, John Z, MD  CC: Migraines  Virtual Visit via Video Note  I connected with Bridget Parker on 07/05/18 at  3:00 PM EDT by a video enabled telemedicine application and verified that I am speaking with the correct person using two identifiers.  Location: Patient: office Provider: office   I discussed the limitations of evaluation and management by telemedicine and the availability of in person appointments. The patient expressed understanding and agreed to proceed.  Follow Up Instructions:    I discussed the assessment and treatment plan with the patient. The patient was provided an opportunity to ask questions and all were answered. The patient agreed with the plan and demonstrated an understanding of the instructions.   The patient was advised to call back or seek an in-person evaluation if the symptoms worsen or if the condition fails to improve as anticipated.  I provided 40 minutes of non-face-to-face time during this encounter.   Anson FretAhern, Jasia Hiltunen B, MD    Her migraines are worsening, especially around menses she had to stay out of work for 2 days. Ongoing for 6 months. They were severe. She did have them other days of the month. But it was the worse during period. Headaches around period severe. But this month she did not have severe migraines. Masks make it worse. She is having 8 moderately severe migraines a month 15 dull headache days   Interval history: Some months she can go without a migraine. Jan and Feb she had none. This month she had a severe 4-day migraine with a heavy menses. Imitrex would make it go away and then she would have the headache the next day and have to take another imitrex. She stopped the propranolol and still the headaches have improved. Can't take Topiramate due to hx of kidney stones  Tried: Keppra, Nortriptyline(Did not  want to take an antidepressant or gain weight), Imitrex(works), zoloft(side effects). Reglan (made her sick).   Interval history: She is improving She has kept a migraine diary. Headaches have decreased, no side effects from propranolol. Discussed options, will increase propranolol and discussed side effects. The imitrex makes her tired, discussed trying other triptans, she did not tolerate the Reglan. She prefers not to try another triptan. Will just change propranolol. Headaches much improved from 15 a month to the following. She has kept a headache diary, discussed her headaches. May: 4 headaches after the 9th June:  4 headaches July: 5 headaches August: 6 headaches.  ZOX:WRUEAVHPI:Bridget L Priceis a 43 y.o.femalehere as a referral from Dr. Darlin CocoHallfor migraines. Migraines started since the age of 43. Worsening in the last 3-5 years. She has 15 headaches a month. Pressure, throbbing, they start on both sides of the head but can start unilaterally on the left mostly. Typically behind the eyes, forehead, sides, pounding, throbbing. No light sensitivity or sound sensitivity. She has sensitivity to smells such as cleaning products or candles or tobacco. Smells trigger the headaches. +nausea but no vomiting. She has a pressure component to her headache. Headache is positional and worse with bending over. She has woken up with the headaches but they usually develop later in the day. They can last for days and they can be severe enough to keep her out work at 8/10 in pain. They are on average 7/10. They are bad. Significantly affecting her life. No FHx of migraines, mother has allergy headaches. The  quality hasn;t changed over the years, but the severity and frequency is worse. Imitrex pill helps, she takes them 10x a month or so. She has tried Zoloft in the past without relief and with xanax and gained weight and she was tired a lot. She recently was prescribed Keppra. No weight gain recently. She was seen by a headache  clinic recently and is here for a second opinion on management. No other focal neurologic symptoms. No vision changes at all, no hearing changing, no neck pain, no meningismus. No aura.   Reviewed notes, labs and imaging from outside physicians, which showed:  Tried: Keppra, Nortriptyline(Did not want to take an antidepressant or gain weight), Imitrex(works), zoloft(side effects). Reglan (made her sick).   Personally reviewed images and agree with the following: MRI of the brain 09/2008 personally reviewed: The brain itself has a normal appearance without evidence of malformation, atrophy, old or acute infarction, mass lesion, hemorrhage, hydrocephalus or extra-axial collection. The pituitary gland is normal. The paranasal sinuses, middle ears and mastoids are clear. There is no anomalous carotid or jugular anatomy. The seventh and eighth nerve complexes appear normal bilaterally. No sign of dural AV fistula. No abnormal contrast enhancement in the region.  IMPRESSION: Normal examination. No cause of pulsatile tinnitus identified.  Reviewed notes from [facility]: Imitrex helps headaches. She tried Nortriptyline but then stopped because she didn't want to be on an anti-depressant. Also did not want to gain weight. She was given Ketoprofen 75mg  prn, Imitrex 100mg  prn and Librium for anxiety. Melatonin at bedtime. Job is stressful. nxiety and a Product/process development scientistworrier. Also with LBP. Diagnosed with migraine and tension type headache. They discussed acupuncture and cephaly, magnesium, roboflavin and coq10, beta blockers and anti-epilepsy medications used in migraine. He started Keppra 250mg  qhs with titration to 750mg  qhs. Oxaprozen prn.   Review of Systems: Patient complains of symptoms per HPI as well as the following symptoms: headache, insomnia, sleepiness, kidney stones. Pertinent negatives per HPI. All others negative.  Pertinent negatives per HPI. All others  negative.  Social History   Socioeconomic History   Marital status: Married    Spouse name: Gerri SporeWesley    Number of children: 0   Years of education: some colle   Highest education level: Not on file  Occupational History   Occupation: [employer]  Social Needs   Financial resource strain: Not on file   Food insecurity    Worry: Not on file    Inability: Not on file   Transportation needs    Medical: Not on file    Non-medical: Not on file  Tobacco Use   Smoking status: Never Smoker   Smokeless tobacco: Never Used  Substance and Sexual Activity   Alcohol use: No   Drug use: No   Sexual activity: Yes    Birth control/protection: Pill  Lifestyle   Physical activity    Days per week: Not on file    Minutes per session: Not on file   Stress: Not on file  Relationships   Social connections    Talks on phone: Not on file    Gets together: Not on file    Attends religious service: Not on file    Active member of club or organization: Not on file    Attends meetings of clubs or organizations: Not on file    Relationship status: Not on file   Intimate partner violence    Fear of current or ex partner: Not on file  Emotionally abused: Not on file    Physically abused: Not on file    Forced sexual activity: Not on file  Other Topics Concern   Not on file  Social History Narrative   Lives with husband   Caffeine use: 1 cup daily (coffee), occasional soda, mostly water   Right-handed    Family History  Problem Relation Age of Onset   Cancer Maternal Grandmother    Prostate cancer Maternal Grandfather    Colon cancer Maternal Grandfather    Lung cancer Paternal Grandmother    Diabetes Paternal Grandfather    Ovarian cancer Maternal Aunt        moms identical twin   Other Maternal Aunt    Migraines Neg Hx     Past Medical History:  Diagnosis Date   Anxiety disorder    GERD (gastroesophageal reflux disease)    High  cholesterol    Kidney stones    Migraines    Palpitations    S/P endoscopy March 2011   mild gastritis, continue Nexium    Past Surgical History:  Procedure Laterality Date   KIDNEY STONES     IN HER 20s stint   TONSILLECTOMY  1983    Current Outpatient Medications  Medication Sig Dispense Refill   chlordiazePOXIDE (LIBRIUM) 5 MG capsule Take 5 mg by mouth every morning.     fluticasone (FLONASE) 50 MCG/ACT nasal spray Place 2 sprays into the nose daily. 16 g 6   Fremanezumab-vfrm (AJOVY) 225 MG/1.5ML SOAJ Inject 225 mg into the skin every 30 (thirty) days. 1 pen 11   loratadine (CLARITIN) 10 MG tablet Take 10 mg by mouth daily.     Melatonin 5 MG TABS Take 10 mg by mouth at bedtime.     Multiple Vitamins-Minerals (MULTIVITAMIN PO) Take by mouth.     Norethin Ace-Eth Estrad-FE (JUNEL FE 24 PO) Take 1 tablet by mouth daily.     SUMAtriptan (IMITREX) 100 MG tablet Take 1 tablet (100 mg total) by mouth every 2 (two) hours as needed for migraine. May repeat in 2 hours if headache persists or recurs. 10 tablet 12   No current facility-administered medications for this visit.     Allergies as of 07/01/2018 - Review Complete 06/30/2018  Allergen Reaction Noted   Sulfonamide derivatives      Vitals: There were no vitals taken for this visit. Last Weight:  Wt Readings from Last 1 Encounters:  06/30/18 124 lb (56.2 kg)   Last Height:   Ht Readings from Last 1 Encounters:  06/30/18 5\' 1"  (1.549 m)     Exam: Gen: NAD, conversant  CV: RRR, no MRG. No Carotid Bruits. No peripheral edema, warm, nontender Eyes: Conjunctivae clear without exudates or hemorrhage  Neuro: Detailed Neurologic Exam  Speech: Speech is normal; fluent and spontaneous with normal comprehension.  Cognition: The patient is oriented to person, place, and time;  recent and remote memory intact;  language fluent;  normal attention, concentration,   fund of knowledge Cranial Nerves: The pupils are equal, round, and reactive to light. The fundi are normal and spontaneous venous pulsations are present. Visual fields are full to finger confrontation. Extraocular movements are intact. Trigeminal sensation is intact and the muscles of mastication are normal. The face is symmetric. The palate elevates in the midline. Hearing intact. Voice is normal. Shoulder shrug is normal. The tongue has normal motion without fasciculations.   Coordination: Normal finger to nose and heel to shin. Normal rapid alternating movements.  Gait: Heel-toe and tandem gait are normal.   Motor Observation: No asymmetry, no atrophy, and no involuntary movements noted. Tone: Normal muscle tone.   Posture: Posture is normal. normal erect  Strength: Strength is V/V in the upper and lower limbs.   Sensation: intact to LT  Reflex Exam:  DTR's: Deep tendon reflexes in the upper and lower extremities are normal bilaterally.  Toes: The toes are downgoing bilaterally.  Clonus: Clonus is absent.      Assessment/Plan:43 year old with chronic migraines  Patient's migraines are worsening.  Frequency and severity corresponding with chronic migraines.  She is failed multiple classes of medications in the past.  We had a long discussion today about preventative medications and her choices and she is decided to start new CGRP medications and continue Imitrex acutely.  Meds ordered this encounter  Medications   Fremanezumab-vfrm (AJOVY) 225 MG/1.5ML SOAJ    Sig: Inject 225 mg into the skin every 30 (thirty) days.    Dispense:  1 pen    Refill:  11    Patient has copay card; she can have medication for $5 regardless of insurance approval or copay amount.   SUMAtriptan (IMITREX) 100 MG tablet    Sig: Take 1 tablet (100 mg total) by mouth every 2 (two) hours as needed for migraine. May repeat in 2 hours if  headache persists or recurs.    Dispense:  10 tablet    Refill:  12     Discussed: To prevent or relieve headaches, try the following: Cool Compress. Lie down and place a cool compress on your head.  Avoid headache triggers. If certain foods or odors seem to have triggered your migraines in the past, avoid them. A headache diary might help you identify triggers.  Include physical activity in your daily routine. Try a daily walk or other moderate aerobic exercise.  Manage stress. Find healthy ways to cope with the stressors, such as delegating tasks on your to-do list.  Practice relaxation techniques. Try deep breathing, yoga, massage and visualization.  Eat regularly. Eating regularly scheduled meals and maintaining a healthy diet might help prevent headaches. Also, drink plenty of fluids.  Follow a regular sleep schedule. Sleep deprivation might contribute to headaches Consider biofeedback. With this mind-body technique, you learn to control certain bodily functions -- such as muscle tension, heart rate and blood pressure -- to prevent headaches or reduce headache pain.    Proceed to emergency room if you experience new or worsening symptoms or symptoms do not resolve, if you have new neurologic symptoms or if headache is severe, or for any concerning symptom.     Sarina Ill, MD  [facility] [address] [place], [place] [address]  Phone [phone] Fax [phone]

## 2018-07-05 ENCOUNTER — Encounter: Payer: Self-pay | Admitting: Neurology

## 2018-07-06 ENCOUNTER — Telehealth: Payer: Self-pay | Admitting: *Deleted

## 2018-07-06 NOTE — Telephone Encounter (Signed)
I called [employer] and spoke with Dyann Ruddle. Obtained insurance information for pharmacy benefit. Also Dyann Ruddle was able to get the Ajovy savings card to go through for $5 so she will call the patient and let her know that they will order the medication and it will be here tomorrow. She understands the PA will still be done and our office will have to call the plan since [facility] will not accept EPA for this.  BCBS BIN: [id number] GROUP: [id number] ID: [id number]

## 2018-07-08 NOTE — Telephone Encounter (Addendum)
Tried to complete PA on CMM. KEY: [id number]. Received this message: This medication may be excluded from the patient's benefit. For more information, please reach out to [employer] directly at [phone].  I called [employer] and was told the plan handles their own authorization. I was transferred to [place] [employer] [place] [phone]. [employer] will fax a form to the office to complete. Case number will be [id number].

## 2018-07-08 NOTE — Telephone Encounter (Signed)
Ajovy PA form completed. Pending MD signature and fax to [facility].

## 2018-07-12 NOTE — Telephone Encounter (Signed)
Ajovy PA form signed and faxed back to [facility]. Received a receipt of confirmation.

## 2018-07-20 NOTE — Telephone Encounter (Signed)
We received an approval from [place] [employer] [place]. Ajovy has been approved effective 07/12/2018 until 01/05/2098.  Faxed approval letter to [place]. Received a receipt of confirmation.

## 2018-12-24 DIAGNOSIS — F410 Panic disorder [episodic paroxysmal anxiety] without agoraphobia: Secondary | ICD-10-CM | POA: Diagnosis not present

## 2018-12-29 ENCOUNTER — Other Ambulatory Visit: Payer: Self-pay | Admitting: Neurology

## 2018-12-29 ENCOUNTER — Other Ambulatory Visit: Payer: Self-pay

## 2019-01-03 ENCOUNTER — Encounter: Payer: Self-pay | Admitting: Certified Nurse Midwife

## 2019-01-03 ENCOUNTER — Ambulatory Visit (INDEPENDENT_AMBULATORY_CARE_PROVIDER_SITE_OTHER): Payer: BC Managed Care – PPO | Admitting: Certified Nurse Midwife

## 2019-01-03 ENCOUNTER — Other Ambulatory Visit: Payer: Self-pay

## 2019-01-03 VITALS — BP 104/70 | HR 68 | Temp 97.5°F | Resp 16 | Ht 61.25 in | Wt 129.0 lb

## 2019-01-03 DIAGNOSIS — Z23 Encounter for immunization: Secondary | ICD-10-CM | POA: Diagnosis not present

## 2019-01-03 DIAGNOSIS — E559 Vitamin D deficiency, unspecified: Secondary | ICD-10-CM | POA: Diagnosis not present

## 2019-01-03 DIAGNOSIS — Z3041 Encounter for surveillance of contraceptive pills: Secondary | ICD-10-CM

## 2019-01-03 DIAGNOSIS — Z01419 Encounter for gynecological examination (general) (routine) without abnormal findings: Secondary | ICD-10-CM | POA: Diagnosis not present

## 2019-01-03 DIAGNOSIS — Z0000 Encounter for general adult medical examination without abnormal findings: Secondary | ICD-10-CM | POA: Diagnosis not present

## 2019-01-03 MED ORDER — JUNEL FE 24 1-20 MG-MCG(24) PO TABS: 1.0000 | ORAL_TABLET | Freq: Every day | ORAL | 3 refills | Status: DC

## 2019-01-03 NOTE — Patient Instructions (Signed)

## 2019-01-03 NOTE — Progress Notes (Signed)
43 y.o. G0P0000 Married  Caucasian Fe here for annual exam. Periods normal no issues. OCP working well, migraines no change and no aura uses Ajovy once monthly with good results. OCP denies any warning signs with use.  Sees Dr Toy Care for [facility], Sees Dr. Jaynee Eagles for [facility]. Needs labs today. Sees Dr. Jenny Reichmann for aex. No health issues today. Requests TDAP today.  Patient's last menstrual period was 12/13/2018 (exact date).          Sexually active: Yes.    The current method of family planning is OCP (estrogen/progesterone).    Exercising: No.  exercise Smoker:  no  Review of Systems  Constitutional: Negative.   HENT: Negative.   Eyes: Negative.   Respiratory: Negative.   Cardiovascular: Negative.   Gastrointestinal: Negative.   Genitourinary: Negative.   Musculoskeletal: Negative.   Skin: Negative.   Neurological: Negative.   Endo/Heme/Allergies: Negative.   Psychiatric/Behavioral: Negative.     Health Maintenance: Pap:  12-25-17 neg HPV HR neg History of Abnormal Pap: no MMG:  03-05-2018 category c density birads 1:neg Self Breast exams: no Colonoscopy:  none BMD:   none TDaP:  Unsure Shingles: no Pneumonia: no Hep C and HIV: not done Labs: yes   reports that she has never smoked. She has never used smokeless tobacco. She reports that she does not drink alcohol or use drugs.  Past Medical History:  Diagnosis Date  . Anxiety disorder   . GERD (gastroesophageal reflux disease)   . High cholesterol   . Kidney stones   . Migraines   . Palpitations   . S/P endoscopy March 2011   mild gastritis, continue Nexium    Past Surgical History:  Procedure Laterality Date  . KIDNEY STONES     IN HER 20s stint  . TONSILLECTOMY  1983    Current Outpatient Medications  Medication Sig Dispense Refill  . AJOVY 225 MG/1.5ML SOSY INJECT 225MG  INTO THE SKIN EVERY 30 DAYS. 1.5 mL 0  . chlordiazePOXIDE (LIBRIUM) 5 MG capsule Take 5 mg by mouth every morning.    .  eszopiclone (LUNESTA) 2 MG TABS tablet TAKE (1) TABLET BY MOUTHEAT BEDTIME.    . fluticasone (FLONASE) 50 MCG/ACT nasal spray Place 2 sprays into the nose daily. 16 g 6  . loratadine (CLARITIN) 10 MG tablet Take 10 mg by mouth daily.    . Multiple Vitamins-Minerals (MULTIVITAMIN PO) Take by mouth.    . Norethin Ace-Eth Estrad-FE (JUNEL FE 24 PO) Take 1 tablet by mouth daily.    . SUMAtriptan (IMITREX) 100 MG tablet Take 1 tablet (100 mg total) by mouth every 2 (two) hours as needed for migraine. May repeat in 2 hours if headache persists or recurs. 10 tablet 12   No current facility-administered medications for this visit.    Family History  Problem Relation Age of Onset  . Cancer Maternal Grandmother   . Prostate cancer Maternal Grandfather   . Colon cancer Maternal Grandfather   . Lung cancer Paternal Grandmother   . Diabetes Paternal Grandfather   . Ovarian cancer Maternal Aunt        moms identical twin  . Other Maternal Aunt   . Migraines Neg Hx     ROS:  Pertinent items are noted in HPI.  Otherwise, a comprehensive ROS was negative.  Exam:   BP 104/70   Pulse 68   Temp (!) 97.5 F (36.4 C) (Skin)   Resp 16   Ht 5' 1.25" (1.556 m)  Wt 129 lb (58.5 kg)   LMP 12/13/2018 (Exact Date)   BMI 24.18 kg/m  Height: 5' 1.25" (155.6 cm) Ht Readings from Last 3 Encounters:  01/03/19 5' 1.25" (1.556 m)  06/30/18 5\' 1"  (1.549 m)  12/25/17 5' 1.25" (1.556 m)    General appearance: alert, cooperative and appears stated age Head: Normocephalic, without obvious abnormality, atraumatic Neck: no adenopathy, supple, symmetrical, trachea midline and thyroid normal to inspection and palpation Lungs: clear to auscultation bilaterally Breasts: normal appearance, no masses or tenderness, No nipple retraction or dimpling, No nipple discharge or bleeding, No axillary or supraclavicular adenopathy Heart: regular rate and rhythm Abdomen: soft, non-tender; no masses,  no  organomegaly Extremities: extremities normal, atraumatic, no cyanosis or edema Skin: Skin color, texture, turgor normal. No rashes or lesions Lymph nodes: Cervical, supraclavicular, and axillary nodes normal. No abnormal inguinal nodes palpated Neurologic: Grossly normal   Pelvic: External genitalia:  no lesions              Urethra:  normal appearing urethra with no masses, tenderness or lesions              Bartholin's and Skene's: normal                 Vagina: normal appearing vagina with normal color and discharge, no lesions              Cervix: anteverted, no cervical motion tenderness and no lesions              Pap taken: No. Bimanual Exam:  Uterus:  normal size, contour, position, consistency, mobility, non-tender and retroverted              Adnexa: normal adnexa and no mass, fullness, tenderness               Rectovaginal: Confirms               Anus:  normal sphincter tone, no lesions  Chaperone present: yes  A:  Well Woman with normal exam  Contraception OCP desired  Migraine no aura history with neurology management  Insomnia with MD management  Immunization due  Screening labs   P:   Reviewed health and wellness pertinent to exam  Reviewed warning signs with OCP and need to advise if occurs.  Rx Junel Fe 24 see order with instructions  Aware if develops aura with migraine will need to be on Progesterone only contraception.  Continue follow up with MD as indicated  TDAP requested  Labs: CBC, CMP, Lipid panel, Vitamin D  Pap smear: no   counseled on breast self exam, mammography screening, feminine hygiene, use and side effects of OCP's, adequate intake of calcium and vitamin D, diet and exercise  return annually or prn  An After Visit Summary was printed and given to the patient.

## 2019-01-03 NOTE — Addendum Note (Signed)
Addended by: Regina Eck on: 01/03/2019 07:31 PM   Modules accepted: Orders

## 2019-01-04 LAB — COMPREHENSIVE METABOLIC PANEL
ALT: 16 IU/L (ref 0–32)
AST: 18 IU/L (ref 0–40)
Albumin/Globulin Ratio: 1.4 (ref 1.2–2.2)
Albumin: 3.9 g/dL (ref 3.8–4.8)
Alkaline Phosphatase: 42 IU/L (ref 39–117)
BUN/Creatinine Ratio: 17 (ref 9–23)
BUN: 13 mg/dL (ref 6–24)
Bilirubin Total: <0.2 mg/dL (ref 0.0–1.2)
CO2: 24 mmol/L (ref 20–29)
Calcium: 9.2 mg/dL (ref 8.7–10.2)
Chloride: 105 mmol/L (ref 96–106)
Creatinine, Ser: 0.77 mg/dL (ref 0.57–1.00)
GFR calc Af Amer: 110 mL/min/{1.73_m2} (ref >59)
GFR calc non Af Amer: 96 mL/min/{1.73_m2} (ref >59)
Globulin, Total: 2.8 g/dL (ref 1.5–4.5)
Glucose: 90 mg/dL (ref 65–99)
Potassium: 4.2 mmol/L (ref 3.5–5.2)
Sodium: 140 mmol/L (ref 134–144)
Total Protein: 6.7 g/dL (ref 6.0–8.5)

## 2019-01-04 LAB — CBC
Hematocrit: 43 % (ref 34.0–46.6)
Hemoglobin: 13.9 g/dL (ref 11.1–15.9)
MCH: 29.9 pg (ref 26.6–33.0)
MCHC: 32.3 g/dL (ref 31.5–35.7)
MCV: 93 fL (ref 79–97)
Platelets: 377 10*3/uL (ref 150–450)
RBC: 4.65 x10E6/uL (ref 3.77–5.28)
RDW: 12.3 % (ref 11.7–15.4)
WBC: 7.9 10*3/uL (ref 3.4–10.8)

## 2019-01-04 LAB — TSH: TSH: 2.71 u[IU]/mL (ref 0.450–4.500)

## 2019-01-04 LAB — VITAMIN D 25 HYDROXY (VIT D DEFICIENCY, FRACTURES): Vit D, 25-Hydroxy: 41.3 ng/mL (ref 30.0–100.0)

## 2019-01-04 LAB — LIPID PANEL
Chol/HDL Ratio: 4.4 ratio (ref 0.0–4.4)
Cholesterol, Total: 218 mg/dL — ABNORMAL HIGH (ref 100–199)
HDL: 50 mg/dL (ref >39)
LDL Chol Calc (NIH): 144 mg/dL — ABNORMAL HIGH (ref 0–99)
Triglycerides: 135 mg/dL (ref 0–149)
VLDL Cholesterol Cal: 24 mg/dL (ref 5–40)

## 2019-02-01 DIAGNOSIS — J019 Acute sinusitis, unspecified: Secondary | ICD-10-CM | POA: Diagnosis not present

## 2019-02-08 ENCOUNTER — Other Ambulatory Visit (HOSPITAL_COMMUNITY): Payer: Self-pay | Admitting: Certified Nurse Midwife

## 2019-02-08 DIAGNOSIS — Z1231 Encounter for screening mammogram for malignant neoplasm of breast: Secondary | ICD-10-CM

## 2019-02-11 DIAGNOSIS — Z23 Encounter for immunization: Secondary | ICD-10-CM | POA: Diagnosis not present

## 2019-03-12 DIAGNOSIS — Z23 Encounter for immunization: Secondary | ICD-10-CM | POA: Diagnosis not present

## 2019-03-17 ENCOUNTER — Ambulatory Visit (HOSPITAL_COMMUNITY)
Admission: RE | Admit: 2019-03-17 | Discharge: 2019-03-17 | Disposition: A | Discharge status: Home / Self Care | Payer: BC Managed Care – PPO | Source: Ambulatory Visit | Attending: Certified Nurse Midwife | Admitting: Certified Nurse Midwife

## 2019-03-17 ENCOUNTER — Other Ambulatory Visit: Payer: Self-pay

## 2019-03-17 DIAGNOSIS — Z1231 Encounter for screening mammogram for malignant neoplasm of breast: Secondary | ICD-10-CM | POA: Diagnosis not present

## 2019-03-17 NOTE — Progress Notes (Signed)
Mammogram reviewed negative  C density. Repeat mammogram yearly with  3 D

## 2019-03-25 ENCOUNTER — Encounter: Payer: Self-pay | Admitting: Certified Nurse Midwife

## 2019-03-29 DIAGNOSIS — M545 Low back pain: Secondary | ICD-10-CM | POA: Diagnosis not present

## 2019-04-15 DIAGNOSIS — M545 Low back pain: Secondary | ICD-10-CM | POA: Diagnosis not present

## 2019-05-06 DIAGNOSIS — M545 Low back pain: Secondary | ICD-10-CM | POA: Diagnosis not present

## 2019-05-11 ENCOUNTER — Other Ambulatory Visit: Payer: Self-pay | Admitting: Neurology

## 2019-05-11 MED ORDER — AJOVY 225 MG/1.5ML ~~LOC~~ SOSY: PREFILLED_SYRINGE | SUBCUTANEOUS | 11 refills | Status: DC

## 2019-05-13 DIAGNOSIS — M545 Low back pain: Secondary | ICD-10-CM | POA: Diagnosis not present

## 2019-05-17 ENCOUNTER — Other Ambulatory Visit: Payer: Self-pay | Admitting: Adult Health Nurse Practitioner

## 2019-05-17 ENCOUNTER — Other Ambulatory Visit (HOSPITAL_COMMUNITY): Payer: Self-pay | Admitting: Adult Health Nurse Practitioner

## 2019-05-17 DIAGNOSIS — M545 Low back pain, unspecified: Secondary | ICD-10-CM

## 2019-05-17 DIAGNOSIS — M546 Pain in thoracic spine: Secondary | ICD-10-CM

## 2019-05-20 DIAGNOSIS — M9902 Segmental and somatic dysfunction of thoracic region: Secondary | ICD-10-CM | POA: Diagnosis not present

## 2019-05-20 DIAGNOSIS — M545 Low back pain: Secondary | ICD-10-CM | POA: Diagnosis not present

## 2019-05-20 DIAGNOSIS — M546 Pain in thoracic spine: Secondary | ICD-10-CM | POA: Diagnosis not present

## 2019-05-20 DIAGNOSIS — M9903 Segmental and somatic dysfunction of lumbar region: Secondary | ICD-10-CM | POA: Diagnosis not present

## 2019-06-08 ENCOUNTER — Other Ambulatory Visit: Payer: Self-pay

## 2019-06-08 ENCOUNTER — Ambulatory Visit (HOSPITAL_COMMUNITY)
Admission: RE | Admit: 2019-06-08 | Discharge: 2019-06-08 | Disposition: A | Discharge status: Home / Self Care | Payer: BC Managed Care – PPO | Source: Ambulatory Visit | Attending: Adult Health Nurse Practitioner | Admitting: Adult Health Nurse Practitioner

## 2019-06-08 DIAGNOSIS — M546 Pain in thoracic spine: Secondary | ICD-10-CM

## 2019-06-08 DIAGNOSIS — M545 Low back pain, unspecified: Secondary | ICD-10-CM

## 2019-09-20 ENCOUNTER — Encounter: Payer: Self-pay | Admitting: Obstetrics and Gynecology

## 2019-09-20 ENCOUNTER — Telehealth: Payer: Self-pay | Admitting: Obstetrics and Gynecology

## 2019-09-20 NOTE — Telephone Encounter (Signed)
Patient is returning call.  °

## 2019-09-20 NOTE — Telephone Encounter (Signed)
Left message to call Kaitlyn at [phone]. 

## 2019-09-20 NOTE — Telephone Encounter (Signed)
Patient asked if Yvonna Alanis has received the " form that was sent to her attention"? She said " My mother-in law has been trying to fax this for me all morning.

## 2019-09-22 ENCOUNTER — Encounter: Payer: Self-pay | Admitting: Obstetrics and Gynecology

## 2019-09-26 NOTE — Telephone Encounter (Signed)
Routing to Gershon Crane, RN regarding form completion.

## 2019-09-26 NOTE — Telephone Encounter (Signed)
Form received today via Mychart. Printed and placed on Dr Salli Quarry desk for review and signature.   Routing to Dr Oscar La

## 2019-12-15 DIAGNOSIS — F411 Generalized anxiety disorder: Secondary | ICD-10-CM | POA: Diagnosis not present

## 2019-12-15 DIAGNOSIS — G4700 Insomnia, unspecified: Secondary | ICD-10-CM | POA: Diagnosis not present

## 2020-01-08 ENCOUNTER — Encounter: Payer: Self-pay | Admitting: Obstetrics and Gynecology

## 2020-01-09 ENCOUNTER — Other Ambulatory Visit: Payer: Self-pay | Admitting: Obstetrics and Gynecology

## 2020-01-09 ENCOUNTER — Encounter: Payer: Self-pay | Admitting: Obstetrics and Gynecology

## 2020-01-09 DIAGNOSIS — J019 Acute sinusitis, unspecified: Secondary | ICD-10-CM | POA: Diagnosis not present

## 2020-01-09 DIAGNOSIS — Z3041 Encounter for surveillance of contraceptive pills: Secondary | ICD-10-CM

## 2020-01-09 DIAGNOSIS — U071 COVID-19: Secondary | ICD-10-CM | POA: Diagnosis not present

## 2020-01-09 MED ORDER — JUNEL FE 24 1-20 MG-MCG(24) PO TABS: 1.0000 | ORAL_TABLET | Freq: Every day | ORAL | 0 refills | Status: DC

## 2020-01-09 NOTE — Progress Notes (Signed)
mychart message sent

## 2020-01-10 ENCOUNTER — Telehealth: Payer: Self-pay

## 2020-01-10 ENCOUNTER — Telehealth: Payer: Self-pay | Admitting: *Deleted

## 2020-01-10 NOTE — Telephone Encounter (Signed)
Rx sent on 01/09/20.   Call to patient to notify. Left detailed message. Advised patient to return call to office if any additional questions.   Encounter closed.

## 2020-01-10 NOTE — Telephone Encounter (Signed)
See telephone encounter dated 01/10/20.    Encounter closed.

## 2020-01-10 NOTE — Telephone Encounter (Signed)
Patient called stating she sent My Chart message yesterday and Dr. Oscar La said she would send one pack of bcp and it is not at pharmacy. I do see where Dr. Shela Commons sent the Rx and I called [facility] as indicated by patient.  They did receive the Rx and said patient has picked it up. I called patient and left message and let her know.

## 2020-01-10 NOTE — Telephone Encounter (Signed)
Iona Beard [facility]!  My pharmacy has sent a fax request three times with no response for my birth control refill. I start a new pack Tuesday.  I tried to contact the office, but keeps telling me your closed.  Can someone let me know that it will be refilled?  I have an appointment scheduled for the 19th for my yearly, DOB July 01, 1975  Thank you  Caryl Asp

## 2020-01-25 ENCOUNTER — Encounter: Payer: Self-pay | Admitting: Obstetrics and Gynecology

## 2020-01-25 ENCOUNTER — Other Ambulatory Visit: Payer: Self-pay

## 2020-01-25 ENCOUNTER — Ambulatory Visit (INDEPENDENT_AMBULATORY_CARE_PROVIDER_SITE_OTHER): Payer: BC Managed Care – PPO | Admitting: Obstetrics and Gynecology

## 2020-01-25 VITALS — BP 128/72 | HR 104 | Ht 61.5 in | Wt 131.0 lb

## 2020-01-25 DIAGNOSIS — Z01419 Encounter for gynecological examination (general) (routine) without abnormal findings: Secondary | ICD-10-CM | POA: Diagnosis not present

## 2020-01-25 DIAGNOSIS — E785 Hyperlipidemia, unspecified: Secondary | ICD-10-CM | POA: Diagnosis not present

## 2020-01-25 DIAGNOSIS — Z0000 Encounter for general adult medical examination without abnormal findings: Secondary | ICD-10-CM

## 2020-01-25 DIAGNOSIS — Z3041 Encounter for surveillance of contraceptive pills: Secondary | ICD-10-CM

## 2020-01-25 LAB — COMPREHENSIVE METABOLIC PANEL
AG Ratio: 1.6 (calc) (ref 1.0–2.5)
ALT: 12 U/L (ref 6–29)
AST: 17 U/L (ref 10–30)
Albumin: 4.2 g/dL (ref 3.6–5.1)
Alkaline phosphatase (APISO): 37 U/L (ref 31–125)
BUN: 17 mg/dL (ref 7–25)
CO2: 27 mmol/L (ref 20–32)
Calcium: 9.3 mg/dL (ref 8.6–10.2)
Chloride: 104 mmol/L (ref 98–110)
Creat: 0.92 mg/dL (ref 0.50–1.10)
Globulin: 2.7 g/dL (calc) (ref 1.9–3.7)
Glucose, Bld: 80 mg/dL (ref 65–99)
Potassium: 4 mmol/L (ref 3.5–5.3)
Sodium: 138 mmol/L (ref 135–146)
Total Bilirubin: 0.3 mg/dL (ref 0.2–1.2)
Total Protein: 6.9 g/dL (ref 6.1–8.1)

## 2020-01-25 LAB — LIPID PANEL
Cholesterol: 207 mg/dL — ABNORMAL HIGH (ref <200)
HDL: 49 mg/dL — ABNORMAL LOW (ref >=50)
LDL Cholesterol (Calc): 129 mg/dL (calc) — ABNORMAL HIGH
Non-HDL Cholesterol (Calc): 158 mg/dL (calc) — ABNORMAL HIGH (ref <130)
Total CHOL/HDL Ratio: 4.2 (calc) (ref <5.0)
Triglycerides: 177 mg/dL — ABNORMAL HIGH (ref <150)

## 2020-01-25 LAB — CBC
HCT: 41.6 % (ref 35.0–45.0)
Hemoglobin: 14.1 g/dL (ref 11.7–15.5)
MCH: 30.9 pg (ref 27.0–33.0)
MCHC: 33.9 g/dL (ref 32.0–36.0)
MCV: 91 fL (ref 80.0–100.0)
MPV: 11.3 fL (ref 7.5–12.5)
Platelets: 342 10*3/uL (ref 140–400)
RBC: 4.57 10*6/uL (ref 3.80–5.10)
RDW: 11.7 % (ref 11.0–15.0)
WBC: 9.5 10*3/uL (ref 3.8–10.8)

## 2020-01-25 MED ORDER — JUNEL FE 24 1-20 MG-MCG(24) PO TABS: 1.0000 | ORAL_TABLET | Freq: Every day | ORAL | 3 refills | Status: DC

## 2020-01-25 NOTE — Progress Notes (Signed)
45 y.o. G0P0000 Married White or Caucasian Not Hispanic or Latino female here for annual exam.   On OCP's. Menses q month, just spots for 3 days. No cramps. No dyspareunia, some dryness, hasn't used a lubricant.  She c/o feeling hot a lot of the time during the day.     Patient's last menstrual period was 01/05/2020 (within days).          Sexually active: Yes.    The current method of family planning is OCP (estrogen/progesterone).    Exercising: No.  The patient does not participate in regular exercise at present. Smoker:  no  Health Maintenance: Pap:  12/25/17 Neg:Neg HR HPV History of abnormal Pap:  no MMG:  03/17/19 BIRADS 1 negative/density c BMD:   n/a Colonoscopy: n/a TDaP:  01/03/19  Gardasil: none   reports that she has never smoked. She has never used smokeless tobacco. She reports that she does not drink alcohol and does not use drugs. 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 Looking for part time work. Husband works 3rd shift. Has 2 dogs.   Past Medical History:  Diagnosis Date  . Anxiety disorder   . GERD (gastroesophageal reflux disease)   . High cholesterol   . Kidney stones   . Migraines   . Palpitations   . S/P endoscopy March 2011   mild gastritis, continue Nexium    Past Surgical History:  Procedure Laterality Date  . KIDNEY STONES     IN HER 20s stint  . TONSILLECTOMY  1983    Current Outpatient Medications  Medication Sig Dispense Refill  . chlordiazePOXIDE (LIBRIUM) 5 MG capsule Take 5 mg by mouth every morning.    . eszopiclone (LUNESTA) 2 MG TABS tablet TAKE (1) TABLET BY MOUTHEAT BEDTIME.    . fluticasone (FLONASE) 50 MCG/ACT nasal spray Place 2 sprays into the nose daily. 16 g 6  . Fremanezumab-vfrm (AJOVY) 225 MG/1.5ML SOSY INJECT 225MG  INTO THE SKIN EVERY 30 DAYS. 1.5 mL 11  . loratadine (CLARITIN) 10 MG tablet Take 10 mg by mouth daily.    . Multiple Vitamins-Minerals (MULTIVITAMIN PO) Take by mouth.     . Norethindrone Acetate-Ethinyl Estrad-FE (JUNEL FE 24) 1-20 MG-MCG(24) tablet Take 1 tablet by mouth daily. 28 tablet 0  . SUMAtriptan (IMITREX) 100 MG tablet Take 1 tablet (100 mg total) by mouth every 2 (two) hours as needed for migraine. May repeat in 2 hours if headache persists or recurs. 10 tablet 12   No current facility-administered medications for this visit.    Family History  Problem Relation Age of Onset  . Cancer Maternal Grandmother   . Prostate cancer Maternal Grandfather   . Colon cancer Maternal Grandfather   . Lung cancer Paternal Grandmother   . Diabetes Paternal Grandfather   . Ovarian cancer Maternal Aunt        moms identical twin  . Other Maternal Aunt   . Migraines Neg Hx   MAunt was in her 41's with diagnosis of ovarian cancer, died from it in 05/14/00. She doesn't think her aunt had genetic testing. Her mom (her identical twin) and her cousins all had negative genetic testing. Mom had her ovaries removed.  MGM died in her 3's.  MGF had colon cancer.   Review of Systems  Constitutional: Negative.   HENT: Negative.   Eyes: Negative.   Respiratory: Negative.   Cardiovascular: Negative.   Gastrointestinal: Negative.   Endocrine: Negative.   Genitourinary:  Negative.   Musculoskeletal: Negative.   Skin: Negative.   Allergic/Immunologic: Negative.   Neurological: Negative.   Hematological: Negative.   Psychiatric/Behavioral: Negative.     Exam:   BP 128/72 (BP Location: Right Arm, Patient Position: Sitting, Cuff Size: Normal)   Pulse (!) 104   Ht 5' 1.5" (1.562 m)   Wt 131 lb (59.4 kg)   LMP 01/05/2020 (Within Days)   BMI 24.35 kg/m   Weight change: @WEIGHTCHANGE @ Height:   Height: 5' 1.5" (156.2 cm)  Ht Readings from Last 3 Encounters:  01/25/20 5' 1.5" (1.562 m)  01/03/19 5' 1.25" (1.556 m)  06/30/18 5\' 1"  (1.549 m)    General appearance: alert, cooperative and appears stated age Head: Normocephalic, without obvious abnormality,  atraumatic Neck: no adenopathy, supple, symmetrical, trachea midline and thyroid normal to inspection and palpation Lungs: clear to auscultation bilaterally Cardiovascular: regular rate and rhythm Breasts: normal appearance, no masses or tenderness Abdomen: soft, non-tender; non distended,  no masses,  no organomegaly Extremities: extremities normal, atraumatic, no cyanosis or edema Skin: Skin color, texture, turgor normal. No rashes or lesions Lymph nodes: Cervical, supraclavicular, and axillary nodes normal. No abnormal inguinal nodes palpated Neurologic: Grossly normal   Pelvic: External genitalia:  no lesions              Urethra:  normal appearing urethra with no masses, tenderness or lesions              Bartholins and Skenes: normal                 Vagina: normal appearing vagina with normal color and discharge, no lesions              Cervix: no lesions               Bimanual Exam:  Uterus:  normal size, contour, position, consistency, mobility, non-tender and retroverted              Adnexa: no mass, fullness, tenderness               Rectovaginal: Confirms               Anus:  normal sphincter tone, no lesions  07/02/18 chaperoned for the exam.  1. Well woman exam Discussed breast self exam Discussed calcium and vit D intake Mammogram in 3/22   2. Encounter for surveillance of contraceptive pills - Norethindrone Acetate-Ethinyl Estrad-FE (JUNEL FE 24) 1-20 MG-MCG(24) tablet; Take 1 tablet by mouth daily.  Dispense: 84 tablet; Refill: 3  3. Laboratory exam ordered as part of routine general medical examination - CBC - Comprehensive metabolic panel - Lipid panel

## 2020-01-25 NOTE — Patient Instructions (Signed)

## 2020-03-06 ENCOUNTER — Other Ambulatory Visit (HOSPITAL_COMMUNITY): Payer: Self-pay | Admitting: Obstetrics and Gynecology

## 2020-03-06 DIAGNOSIS — Z1231 Encounter for screening mammogram for malignant neoplasm of breast: Secondary | ICD-10-CM

## 2020-03-19 ENCOUNTER — Ambulatory Visit (HOSPITAL_COMMUNITY)
Admission: RE | Admit: 2020-03-19 | Discharge: 2020-03-19 | Disposition: A | Discharge status: Home / Self Care | Payer: BC Managed Care – PPO | Source: Ambulatory Visit | Attending: Obstetrics and Gynecology | Admitting: Obstetrics and Gynecology

## 2020-03-19 ENCOUNTER — Other Ambulatory Visit: Payer: Self-pay

## 2020-03-19 DIAGNOSIS — Z1231 Encounter for screening mammogram for malignant neoplasm of breast: Secondary | ICD-10-CM | POA: Diagnosis not present

## 2020-05-18 ENCOUNTER — Other Ambulatory Visit: Payer: Self-pay | Admitting: Neurology

## 2020-07-11 ENCOUNTER — Telehealth: Payer: Self-pay | Admitting: Neurology

## 2020-07-11 ENCOUNTER — Other Ambulatory Visit: Payer: Self-pay | Admitting: Neurology

## 2020-07-11 ENCOUNTER — Encounter: Payer: Self-pay | Admitting: Family Medicine

## 2020-07-11 DIAGNOSIS — G43709 Chronic migraine without aura, not intractable, without status migrainosus: Secondary | ICD-10-CM

## 2020-07-11 MED ORDER — EMGALITY 120 MG/ML ~~LOC~~ SOAJ: 120.0000 mg | SUBCUTANEOUS | 11 refills | Status: DC

## 2020-07-11 NOTE — Telephone Encounter (Signed)
PA completed on CMM/[facility]. TZG:[id number] Will wait for results.

## 2020-07-12 ENCOUNTER — Other Ambulatory Visit: Payer: Self-pay | Admitting: *Deleted

## 2020-07-12 ENCOUNTER — Telehealth: Payer: Self-pay | Admitting: *Deleted

## 2020-07-12 NOTE — Telephone Encounter (Signed)
Completed Emgality PA on Cover My Meds. Key: [id number]. Awaiting determination from [facility].

## 2020-07-16 ENCOUNTER — Telehealth: Payer: Self-pay | Admitting: Family Medicine

## 2020-07-17 ENCOUNTER — Telehealth: Payer: Self-pay | Admitting: Neurology

## 2020-07-17 NOTE — Telephone Encounter (Signed)
Pt called stating that the appt time that she has been give will not work for her and is wanting to see if she will be able to be fit in at the time that she takes her lunch. Pt states that is around 1 or 1:15pm. Please advise.

## 2020-07-17 NOTE — Telephone Encounter (Signed)
Spoke to pt.  Due to her work schedule she is not able to make the VV next week.  Kept appt 11/2020 with AmyNP. She will call to see if cancellation on AL/NP schedule any sooner.  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  GRP # [id number]

## 2020-07-17 NOTE — Telephone Encounter (Signed)
PA was denied stating patient couldn't be on more than one CGRP for prevention. Pt is only on Emgality. Question must have been answered incorrectly inadvertently. Spoke with rep at [facility] [phone] and was told another PA could be submitted. PA sent to plan on Cover My Meds. Key: [id number]. Awaiting determination from [facility].

## 2020-07-17 NOTE — Telephone Encounter (Signed)
Taken care of by [facility] in another phone note

## 2020-07-18 NOTE — Telephone Encounter (Signed)
Request Reference Number: [id number]. EMGALITY INJ 120MG /ML is approved through 01/17/2021. Your patient may now fill this prescription and it will be covered.  Pt made aware.

## 2020-07-24 ENCOUNTER — Ambulatory Visit: Payer: Self-pay | Admitting: Family Medicine

## 2020-07-26 ENCOUNTER — Ambulatory Visit: Payer: BC Managed Care – PPO | Admitting: Family Medicine

## 2020-07-30 ENCOUNTER — Telehealth: Payer: Self-pay | Admitting: *Deleted

## 2020-07-30 MED ORDER — AJOVY 225 MG/1.5ML ~~LOC~~ SOAJ: 225.0000 mg | SUBCUTANEOUS | 0 refills | Status: DC

## 2020-07-30 NOTE — Telephone Encounter (Signed)
-----   Message from Bertram Savin, RN sent at 07/30/2020  9:18 AM EDT ----- Regarding: FW: Ajovy samples  ----- Message ----- From: Anson Fret, MD Sent: 07/29/2020   4:42 PM EDT To: Blenda Mounts 4 Results Subject: Ajovy samples                                  Patient will be by to get 2 Ajovy samples. Please put her name on them in a paper bag in to the fridge. She will come to the front desk thanks

## 2020-07-30 NOTE — Telephone Encounter (Signed)
Order placed for 2 Ajovy pen samples (225 mg into the skin every 30 days). MAR sheet placed in bag with medications. Patient to pick up later this week.

## 2020-08-02 ENCOUNTER — Telehealth: Payer: Self-pay | Admitting: *Deleted

## 2020-08-02 ENCOUNTER — Ambulatory Visit: Payer: BC Managed Care – PPO | Admitting: Neurology

## 2020-08-02 NOTE — Telephone Encounter (Signed)
Pt here for samples of Emgality given [facility] [id number] exp 9/23.  She is aware to refrigerate.

## 2020-10-23 IMAGING — MR MR LUMBAR SPINE W/O CM
4 of 5 series · 13 of 48 positions shown · non-contrast
Comparison: CT abdomen/pelvis 12/08/2009

CLINICAL DATA: Low back pain, unspecified back pain laterality,
unspecified chronicity, unspecified whether sciatica present.
Additional history provided by scanning technologist: Patient
reports back pain, thoracic and lumbar for 2 months.

EXAM:
MRI LUMBAR SPINE WITHOUT CONTRAST
TECHNIQUE: Multiplanar, multisequence MR imaging of the lumbar spine was
performed. No intravenous contrast was administered.

[Series 2: T2 · sagittal · 4.0mm · 0.43mm/px · 4 of 15 slices shown (1 of 3)]
[im 1/15]
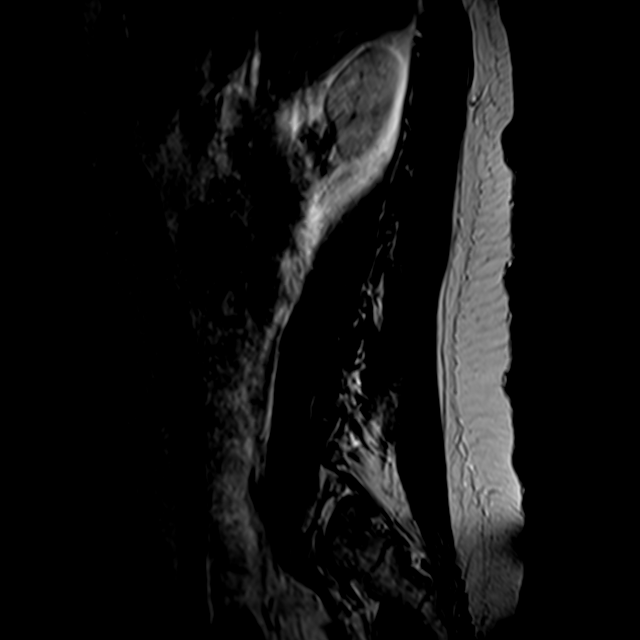
[im 4/15]
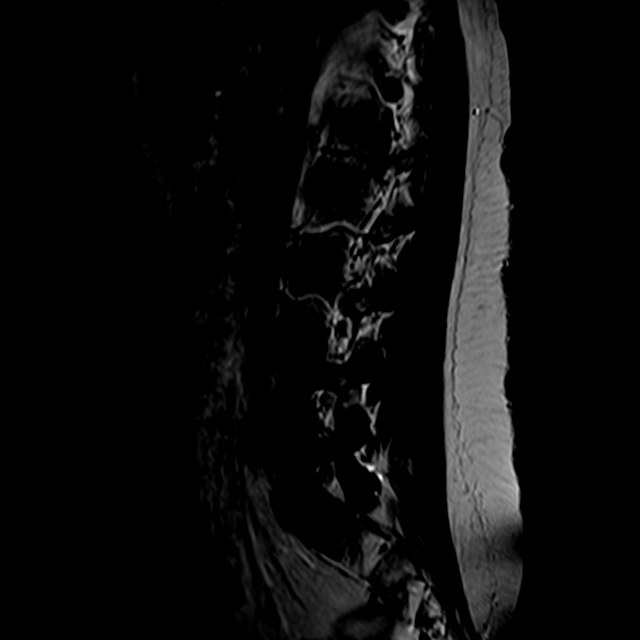
[im 8/15]
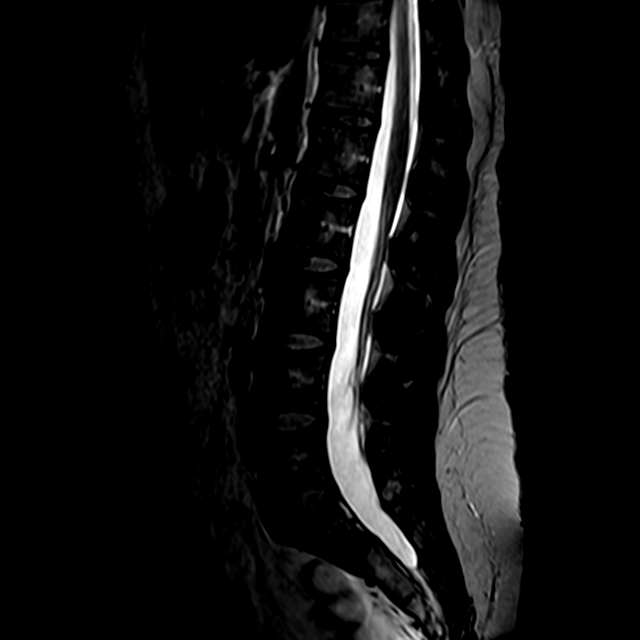
[im 15/15]
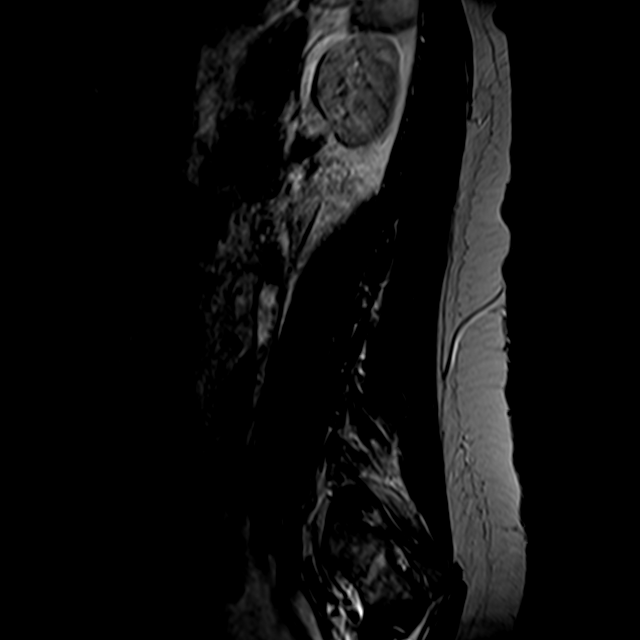

[Series 3: T1 · sagittal · 4.0mm · 0.43mm/px · 3 of 15 slices shown]
[im 1/15]
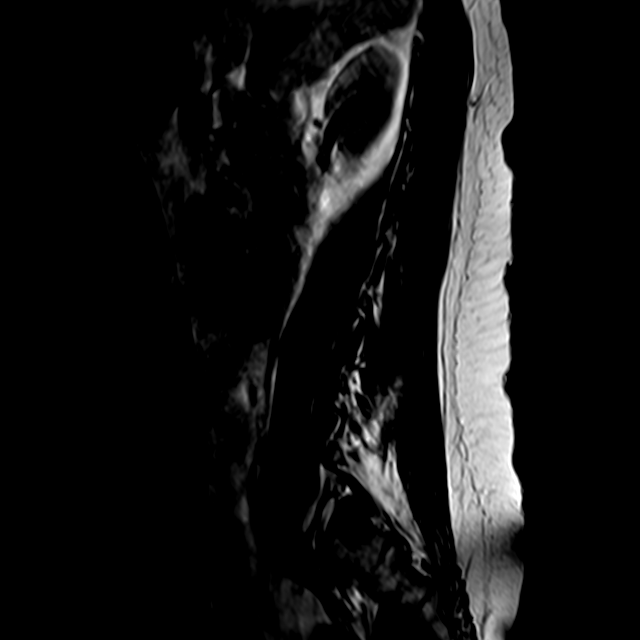
[im 8/15]
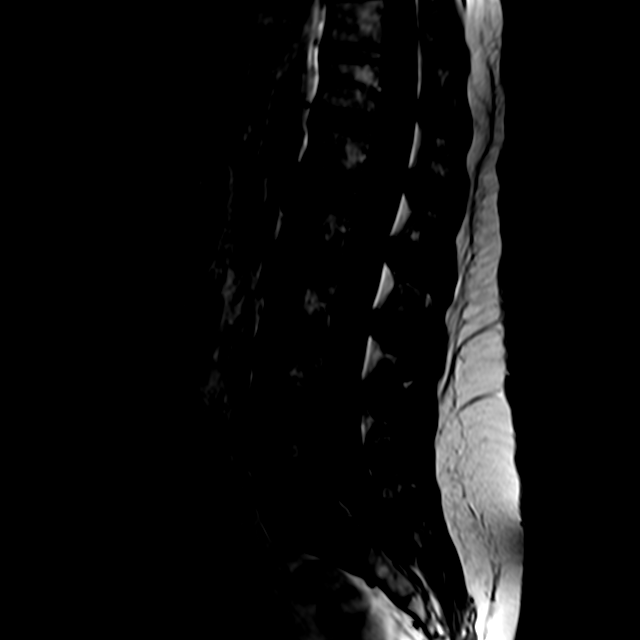
[im 15/15]
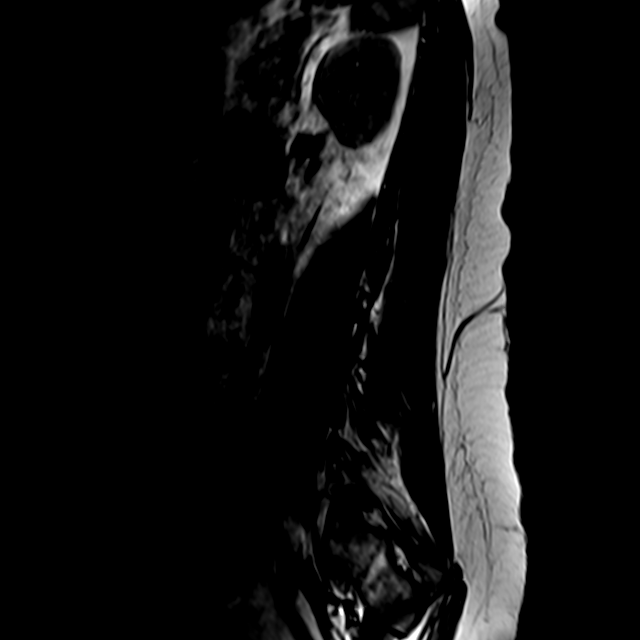

[Series 4: T2 · sagittal · 4.0mm · 0.43mm/px · 3 of 16 slices shown (2 of 3)]
[im 4/16]
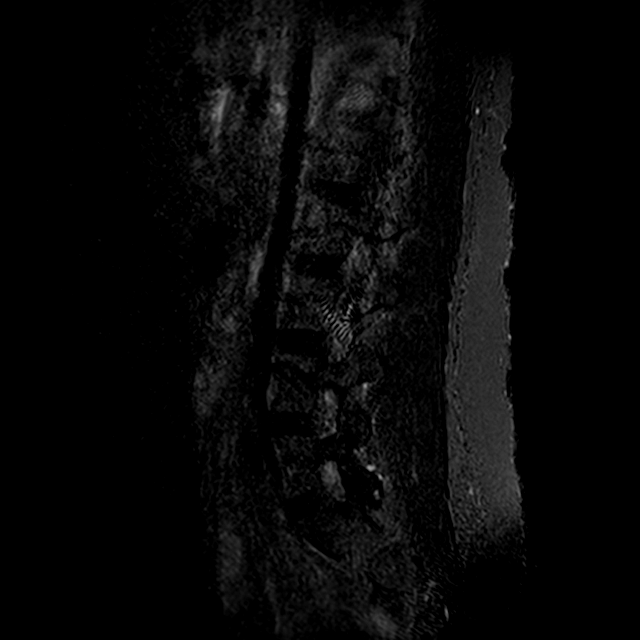
[im 10/16]
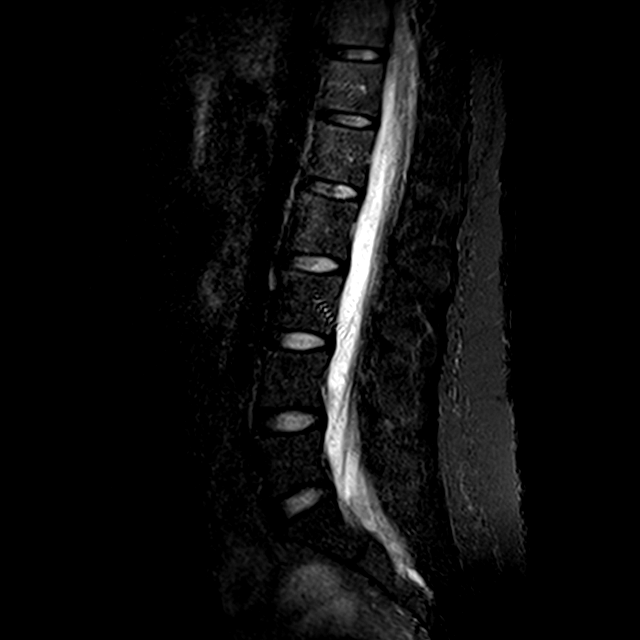
[im 16/16]
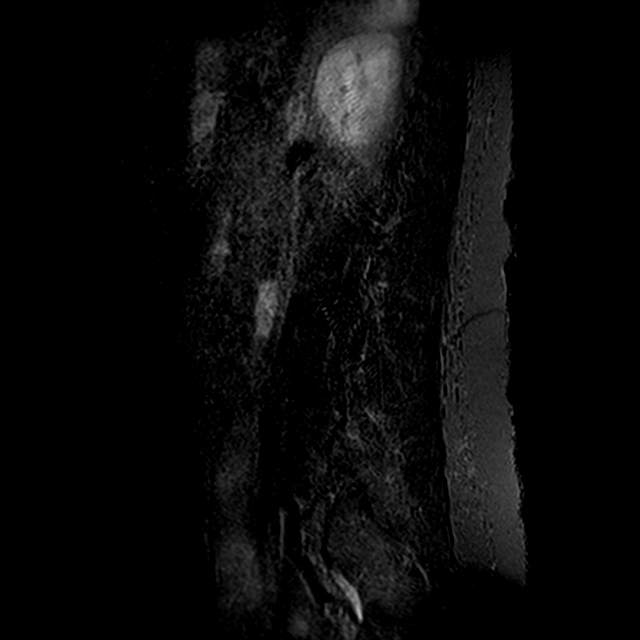

[Series 5: T2 · axial · 4.0mm · 0.32mm/px · z∈[-487,-322]mm · 3 of 45 slices shown (3 of 3)]
[im 6/45]
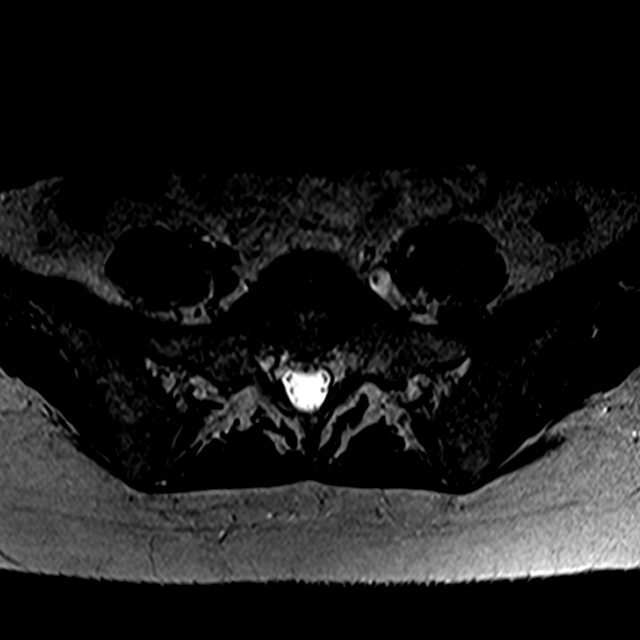
[im 24/45]
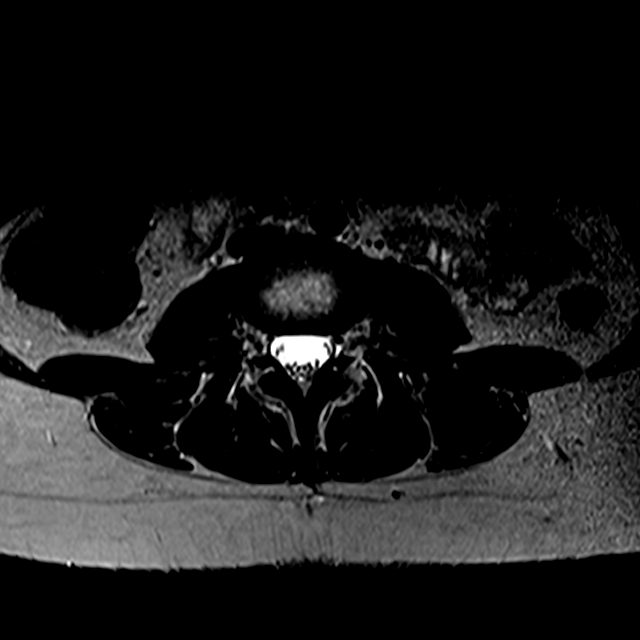
[im 39/45]
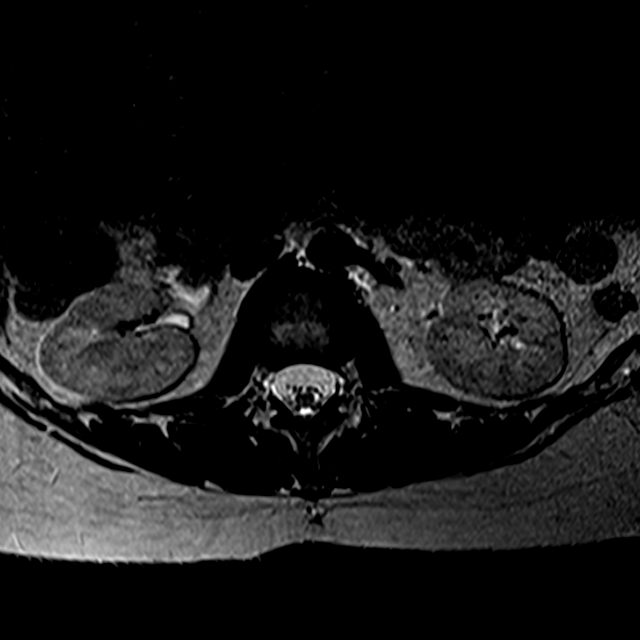

[13 of 48 positions shown; findings below may reference images not displayed]

FINDINGS: Segmentation: 5 lumbar vertebrae in correlating with prior CT
abdomen/pelvis 12/08/2009.

Alignment:  No significant spondylolisthesis.

Vertebrae: Vertebral body height is maintained. No suspicious
osseous lesion or significant marrow edema.

Conus medullaris and cauda equina: Conus extends to the L2 level. No
signal abnormality within the visualized distal spinal cord.

Paraspinal and other soft tissues: No abnormality identified within
included portions of the abdomen/retroperitoneum. Paraspinal soft
tissues within normal limits.

Disc levels:

Intervertebral disc height and hydration are maintained throughout
the lumbar spine.

There is no significant disc herniation, spinal canal stenosis or
neural foraminal narrowing at any level.
IMPRESSION: Unremarkable examination. No significant disc herniation, spinal
canal stenosis or neural foraminal narrowing at any level.

## 2020-11-15 ENCOUNTER — Ambulatory Visit: Payer: BC Managed Care – PPO | Admitting: Family Medicine

## 2020-12-03 DIAGNOSIS — J069 Acute upper respiratory infection, unspecified: Secondary | ICD-10-CM | POA: Diagnosis not present

## 2020-12-06 ENCOUNTER — Other Ambulatory Visit: Payer: Self-pay | Admitting: Neurology

## 2020-12-06 ENCOUNTER — Encounter: Payer: Self-pay | Admitting: Neurology

## 2020-12-06 MED ORDER — AJOVY 225 MG/1.5ML ~~LOC~~ SOAJ: 225.0000 mg | SUBCUTANEOUS | 1 refills | Status: DC

## 2020-12-06 NOTE — Telephone Encounter (Signed)
I called pt and she states that she has been on ajovy, last refill 11-10-2020. Due next week. New insurance now (that's why able to get ajovy).  She stated she never got emaglity samples.  [employer] ID [id number] RX BIN [id number] RX GRP [id number] under her husband Clemma Johnsen  pharm # [phone].  Never took emgality.  I told her that I can refill ajovy, has appt 01-2021 with Dr. Anitra Lauth VV.

## 2020-12-12 DIAGNOSIS — J069 Acute upper respiratory infection, unspecified: Secondary | ICD-10-CM | POA: Diagnosis not present

## 2020-12-27 DIAGNOSIS — R0981 Nasal congestion: Secondary | ICD-10-CM | POA: Diagnosis not present

## 2020-12-27 DIAGNOSIS — J069 Acute upper respiratory infection, unspecified: Secondary | ICD-10-CM | POA: Diagnosis not present

## 2020-12-27 DIAGNOSIS — J029 Acute pharyngitis, unspecified: Secondary | ICD-10-CM | POA: Diagnosis not present

## 2020-12-27 DIAGNOSIS — R059 Cough, unspecified: Secondary | ICD-10-CM | POA: Diagnosis not present

## 2021-01-03 ENCOUNTER — Other Ambulatory Visit: Payer: Self-pay | Admitting: Obstetrics and Gynecology

## 2021-01-03 DIAGNOSIS — Z3041 Encounter for surveillance of contraceptive pills: Secondary | ICD-10-CM

## 2021-01-03 NOTE — Telephone Encounter (Signed)
Annual exam scheduled on 02/21/21 Last mammogram 03/2020

## 2021-01-08 ENCOUNTER — Telehealth: Payer: Self-pay

## 2021-01-08 NOTE — Telephone Encounter (Signed)
I submitted a PA request for Ajovy on CMM, Key: [id number]. Awaiting determination from [facility]

## 2021-01-14 NOTE — Telephone Encounter (Signed)
Approved, effective from 01/08/2021 through 04/01/2021.

## 2021-01-15 ENCOUNTER — Encounter: Payer: Self-pay | Admitting: Neurology

## 2021-01-15 ENCOUNTER — Ambulatory Visit: Payer: BC Managed Care – PPO | Admitting: Neurology

## 2021-01-15 VITALS — BP 136/82 | HR 104 | Ht 61.0 in | Wt 136.0 lb

## 2021-01-15 DIAGNOSIS — G43709 Chronic migraine without aura, not intractable, without status migrainosus: Secondary | ICD-10-CM | POA: Diagnosis not present

## 2021-01-15 MED ORDER — ELETRIPTAN HYDROBROMIDE 40 MG PO TABS: 40.0000 mg | ORAL_TABLET | ORAL | 11 refills | Status: DC | PRN

## 2021-01-15 NOTE — Patient Instructions (Signed)
Her insurance has changed. His new insurance will not happen until April. Ajovy helps tremendously. She went to get a refill for this month, it was approved but it ws going to be $600 because of her deductable. We are trying the new copay card, we provided the Ajovy foundation, and we are calling the pharmact to see, she has done so well on Ajovy would be a shame, giving 4 months samples until new insurance in April.   Try Relpax acutely  Eletriptan Tablets What is this medication? ELETRIPTAN (el ih TRIP tan) treats migraines. It works by blocking pain signals and narrowing blood vessels in the brain. It belongs to a group of medications called triptans. It is not used to prevent migraines. This medicine may be used for other purposes; ask your health care provider or pharmacist if you have questions. COMMON BRAND NAME(S): Relpax What should I tell my care team before I take this medication? They need to know if you have any of these conditions: Cigarette smoker Circulation problems in fingers and toes Diabetes Heart disease High blood pressure High cholesterol History of irregular heartbeat History of stroke Kidney disease Liver disease Stomach or intestine problems An unusual or allergic reaction to eletriptan, other medications, foods, dyes, or preservatives Pregnant or trying to get pregnant Breast-feeding How should I use this medication? Take this medication by mouth with a glass of water. Follow the directions on the prescription label. Do not take it more often than directed. Talk to your care team regarding the use of this medication in children. Special care may be needed. Overdosage: If you think you have taken too much of this medicine contact a poison control center or emergency room at once. NOTE: This medicine is only for you. Do not share this medicine with others. What if I miss a dose? This does not apply. This medication is not for regular use. What may interact  with this medication? Do not take this medication with any of the following: Ceritinib Certain antibiotics like clarithromycin or telithromycin Certain antivirals for HIV or hepatitis Certain medications for fungal infections like ketoconazole, itraconazole, or posaconazole Chloramphenicol Ergot alkaloids like dihydroergotamine, ergonovine, ergotamine, methylergonovine Idelalisib Mifepristone Nefazodone Other medications for migraine headache like almotriptan, frovatriptan, naratriptan, rizatriptan, sumatriptan, zolmitriptan Ribociclib This medication may also interact with the following: Certain medications for depression, anxiety, or psychotic disorders MAOIs like Carbex, Eldepryl, Marplan, Nardil, and Parnate This list may not describe all possible interactions. Give your health care provider a list of all the medicines, herbs, non-prescription drugs, or dietary supplements you use. Also tell them if you smoke, drink alcohol, or use illegal drugs. Some items may interact with your medicine. What should I watch for while using this medication? Visit your care team for regular checks on your progress. Tell your care team if your symptoms do not start to get better or if they get worse. You may get drowsy or dizzy. Do not drive, use machinery, or do anything that needs mental alertness until you know how this medication affects you. Do not stand up or sit up quickly, especially if you are an older patient. This reduces the risk of dizzy or fainting spells. Alcohol may interfere with the effect of this medication. Your mouth may get dry. Chewing sugarless gum or sucking hard candy and drinking plenty of water may help. Contact your care team if the problem does not go away or is severe. If you take migraine medications for 10 or more days a month,  your migraines may get worse. Keep a diary of headache days and medication use. Contact your care team if your migraine attacks occur more  frequently. What side effects may I notice from receiving this medication? Side effects that you should report to your care team as soon as possible: Allergic reactions--skin rash, itching, hives, swelling of the face, lips, tongue, or throat Burning, pain, tingling, or color changes in the legs or feet Heart attack--pain or tightness in the chest, shoulders, arms, or jaw, nausea, shortness of breath, cold or clammy skin, feeling faint or lightheaded Heart rhythm changes--fast or irregular heartbeat, dizziness, feeling faint or lightheaded, chest pain, trouble breathing Increase in blood pressure Irritability, confusion, fast or irregular heartbeat, muscle stiffness, twitching muscles, sweating, high fever, seizure, chills, vomiting, diarrhea, which may be signs of serotonin syndrome Raynaud's--cool, numb, or painful fingers or toes that may change color from pale, to blue, to red Seizures Stroke--sudden numbness or weakness of the face, arm, or leg, trouble speaking, confusion, trouble walking, loss of balance or coordination, dizziness, severe headache, change in vision Sudden or severe stomach pain, nausea, vomiting, fever, or bloody diarrhea Vision loss Side effects that usually do not require medical attention (report to your care team if they continue or are bothersome): Dizziness General discomfort or fatigue This list may not describe all possible side effects. Call your doctor for medical advice about side effects. You may report side effects to FDA at 1-800-FDA-1088. Where should I keep my medication? Keep out of the reach of children and pets. Store at room temperature between 15 and 30 degrees C (59 and 86 degrees F). Throw away any unused medication after the expiration date. NOTE: This sheet is a summary. It may not cover all possible information. If you have questions about this medicine, talk to your doctor, pharmacist, or health care provider.  2022 Elsevier/Gold Standard  (2020-02-01 00:00:00)

## 2021-01-15 NOTE — Progress Notes (Signed)
[id number]    Provider:  Dr Lucia Gaskins Referring Provider: Benita Stabile, MD Primary Care Physician:  Benita Stabile, MD   CC:  Migraines  01/15/2021: Her insurance has changed. His new insurance will not happen until April. Ajovy helps tremendously. She went to get a refill for this month, it was approved but it ws going to be $600 because of her deductable. We are trying the new copay card, we provided the Ajovy foundation, and we are calling the pharmact to see, she has done so well on Ajovy would be a shame, giving 4 months samples until new insurance in April. No other focal neurologic deficits, associated symptoms, inciting events or modifiable factors.  Patient complains of symptoms per HPI as well as the following symptoms: migraines . Pertinent negatives and positives per HPI. All others negative  6/25/202: Her migraines are worsening, especially around menses she had to stay out of work for 2 days. Ongoing for 6 months. They were severe. She did have them other days of the month. But it was the worse during period. Headaches around period severe. But this month she did not have severe migraines. Masks make it worse. She is having 8 moderately severe migraines a month 15 dull headache days   Interval history: Some months she can go without a migraine. Jan and Feb she had none. This month she had a severe 4-day migraine with a heavy menses. Imitrex would make it go away and then she would have the headache the next day and have to take another imitrex. She stopped the propranolol and still the headaches have improved. Can't take Topiramate due to hx of kidney stones  Tried: Keppra, Nortriptyline(Did not want to take an antidepressant or gain weight), Imitrex(works), zoloft(side effects). Reglan (made her sick).    Interval history: She is improving She has kept a migraine diary. Headaches have decreased, no side effects from propranolol. Discussed options, will increase  propranolol and discussed side effects. The imitrex makes her tired, discussed trying other triptans, she did not tolerate the Reglan. She prefers not to try another triptan. Will just change propranolol. Headaches much improved from 15 a month to the following. She has kept a headache diary, discussed her headaches. May: 4 headaches after the 9th June:  4 headaches July: 5 headaches August: 6 headaches.   HPI:  Bridget Parker is a 46 y.o. female here as a referral from Dr. Margo Aye for migraines. Migraines started since the age of 45. Worsening in the last 3-5 years. She has 15 headaches a month. Pressure, throbbing, they start on both sides of the head but can start unilaterally on the left mostly. Typically behind the eyes, forehead, sides, pounding, throbbing. No light sensitivity or sound sensitivity. She has sensitivity to smells such as cleaning products or candles or tobacco. Smells trigger the headaches. +nausea but no vomiting. She has a pressure component to her headache. Headache is positional and worse with bending over. She has woken up with the headaches but they usually develop later in the day. They can last for days and they can be severe enough to keep her out work at 8/10 in pain. They are on average 7/10. They are bad. Significantly affecting her life. No FHx of migraines, mother has allergy headaches. The quality hasn;t changed over the years, but the severity and frequency is worse. Imitrex pill helps, she takes them 10x a month or so. She has tried Zoloft in the past without relief and  with xanax and gained weight and she was tired a lot. She recently was prescribed Keppra. No weight gain recently. She was seen by a headache clinic recently and is here for a second opinion on management. No other focal neurologic symptoms. No vision changes at all, no hearing changing, no neck pain, no meningismus. No aura.    Reviewed notes, labs and imaging from outside physicians, which showed:   Tried:  Keppra, Nortriptyline(Did not want to take an antidepressant or gain weight), Imitrex(works), zoloft(side effects). Reglan (made her sick).    Personally reviewed images and agree with the following: MRI of the brain 09/2008 personally reviewed: The brain itself has a normal appearance without evidence of malformation, atrophy, old or acute infarction, mass lesion, hemorrhage, hydrocephalus or extra-axial collection.  The pituitary gland is normal.  The paranasal sinuses, middle ears and mastoids are clear.  There is no anomalous carotid or jugular anatomy.  The seventh and eighth nerve complexes appear normal bilaterally.  No sign of dural AV fistula.  No abnormal contrast enhancement in the region.    IMPRESSION: Normal examination.  No cause of pulsatile tinnitus identified.   Reviewed notes from [facility]: Imitrex helps headaches. She tried Nortriptyline but then stopped because she didn't want to be on an anti-depressant. Also did not want to gain weight. She was given Ketoprofen 75mg  prn, Imitrex 100mg  prn and Librium for anxiety. Melatonin at bedtime. Job is stressful. nxiety and a . Also with LBP. Diagnosed with migraine and tension type headache. They discussed acupuncture and cephaly, magnesium, roboflavin and coq10, beta blockers and anti-epilepsy medications used in migraine. He started Keppra 250mg  qhs with titration to 750mg  qhs. Oxaprozen prn.     Review of Systems: Patient complains of symptoms per HPI as well as the following symptoms: migraines . Pertinent negatives and positives per HPI. All others negative   Social History   Socioeconomic History   Marital status: Married    Spouse name:    Number of children: 0   Years of education: some colle   Highest education level: Not on file  Occupational History   Occupation: [employer]  Tobacco Use   Smoking status: Never   Smokeless tobacco: Never  Vaping Use   Vaping Use:  Never used  Substance and Sexual Activity   Alcohol use: No   Drug use: No   Sexual activity: Yes    Birth control/protection: OCP  Other Topics Concern   Not on file  Social History Narrative   Lives with husband   Caffeine use: 1 cup daily (coffee), occasional soda, mostly water   Right-handed   Social Determinants of Health   Financial Resource Strain: Not on file  Food Insecurity: Not on file  Transportation Needs: Not on file  Physical Activity: Not on file  Stress: Not on file  Social Connections: Not on file  Intimate Partner Violence: Not on file    Family History  Problem Relation Age of Onset   Cancer Maternal Grandmother    Prostate cancer Maternal Grandfather    Colon cancer Maternal Grandfather    Lung cancer Paternal Grandmother    Diabetes Paternal Grandfather    Ovarian cancer Maternal Aunt        moms identical twin   Other Maternal Aunt    Migraines Neg Hx     Past Medical History:  Diagnosis Date   Anxiety disorder    GERD (gastroesophageal reflux disease)  High cholesterol    Kidney stones    Migraines    Palpitations    S/P endoscopy March 2011   mild gastritis, continue Nexium    Past Surgical History:  Procedure Laterality Date   KIDNEY STONES     IN HER 20s stint   TONSILLECTOMY  1983    Current Outpatient Medications  Medication Sig Dispense Refill   chlordiazePOXIDE (LIBRIUM) 5 MG capsule Take 5 mg by mouth every morning.     eletriptan (RELPAX) 40 MG tablet Take 1 tablet (40 mg total) by mouth as needed for migraine or headache. May repeat in 2 hours if headache persists or recurs. 9 tablet 11   eszopiclone (LUNESTA) 2 MG TABS tablet 3 mg.     fluticasone (FLONASE) 50 MCG/ACT nasal spray Place 2 sprays into the nose daily. 16 g 6   Fremanezumab-vfrm (AJOVY) 225 MG/1.5ML SOAJ Inject 225 mg into the skin every 30 (thirty) days. 1 mL 1   HAILEY 24 FE 1-20 MG-MCG(24) tablet TAKE (1) TABLET BY MOUTH ONCE DAILY. 84 tablet 0    loratadine (CLARITIN) 10 MG tablet Take 10 mg by mouth daily.     Multiple Vitamins-Minerals (MULTIVITAMIN PO) Take by mouth.     No current facility-administered medications for this visit.    Allergies as of 01/15/2021 - Review Complete 01/15/2021  Allergen Reaction Noted   Sulfonamide derivatives      Vitals: BP 136/82    Pulse (!) 104    Ht 5\' 1"  (1.549 m)    Wt 136 lb (61.7 kg)    BMI 25.70 kg/m  Last Weight:  Wt Readings from Last 1 Encounters:  01/15/21 136 lb (61.7 kg)   Last Height:   Ht Readings from Last 1 Encounters:  01/15/21 5\' 1"  (1.549 m)    Exam: NAD, pleasant                  Speech:    Speech is normal; fluent and spontaneous with normal comprehension.  Cognition:    The patient is oriented to person, place, and time;     recent and remote memory intact;     language fluent;    Cranial Nerves:    The pupils are equal, round, and reactive to light.Trigeminal sensation is intact and the muscles of mastication are normal. The face is symmetric. The palate elevates in the midline. Hearing intact. Voice is normal. Shoulder shrug is normal. The tongue has normal motion without fasciculations.   Coordination:  No dysmetria  Motor Observation:    No asymmetry, no atrophy, and no involuntary movements noted. Tone:    Normal muscle tone.     Strength:    Strength is V/V in the upper and lower limbs.      Sensation: intact to LT      Assessment/Plan:  46 year old with chronic migraines  Her insurance has changed. His new insurance will not happen until April. Ajovy helps tremendously. She went to get a refill for this month, it was approved but it ws going to be $600 because of her deductable. We are trying the new copay card, we provided the Ajovy foundation, and we are calling the pharmact to see, she has done so well on Ajovy would be a shame, giving 4 months samples until new insurance in April.   Ajovy works incredibly well, will check back in April  to see how insurance is going if we need to do another PA or try  something different. Unfortunately if we try all this, she just may need to fulfill her deductible before she can continue Ajovy we can help as much as we can but we cannot provide indefinite samples.   Meds ordered this encounter  Medications   eletriptan (RELPAX) 40 MG tablet    Sig: Take 1 tablet (40 mg total) by mouth as needed for migraine or headache. May repeat in 2 hours if headache persists or recurs.    Dispense:  9 tablet    Refill:  11         Bridget DeanAntonia Jisela Merlino, MD   [facility] [address] [place], [place] [id number]   Phone [phone] Fax [phone]    I spent 40 minutes of face-to-face and non-face-to-face time with patient on the  1. Chronic migraine w/o aura w/o status migrainosus, not intractable    diagnosis.  This included previsit chart review, lab review, study review, order entry, electronic health record documentation, patient education on the different diagnostic and therapeutic options, counseling and coordination of care, risks and benefits of management, compliance, or risk factor reduction, this included calling pharmacies, insurance, signing her up for copay ard, exlaining the [employer] paperwork.

## 2021-02-12 NOTE — Progress Notes (Signed)
46 y.o. G0P0000 Married White or Caucasian Not Hispanic or Latino female here for annual exam.  She is on OCP's.  Cycles q month x 3-4 days, very light, no cramps. She gets hot during the day, no surges of heat. Can get sweaty during the day. Hot at night, no sweating.   No libido, not enjoying sex, entry dyspareunia, some deep dyspareunia.   H/O migraines without aura's.  Patient's last menstrual period was 01/30/2021 (approximate).          Sexually active: Yes.    The current method of family planning is OCP (estrogen/progesterone).    Exercising: No.   Smoker:  no  Health Maintenance: Pap: 12/25/17- WNL, HPV- neg; 10/11/2010- WNL History of abnormal Pap:  no MMG: 03/19/20-neg birads 1 BMD: n/a Colonoscopy: n/a TDaP:  01/03/19 Gardasil: n/a   reports that she has never smoked. She has never used smokeless tobacco. She reports that she does not drink alcohol and does not use drugs. Husband works in a distribution center, weird schedule. She isn't currently working.   Past Medical History:  Diagnosis Date   Anxiety disorder    GERD (gastroesophageal reflux disease)    High cholesterol    Kidney stones    Migraines    Palpitations    S/P endoscopy March 2011   mild gastritis, continue Nexium    Past Surgical History:  Procedure Laterality Date   KIDNEY STONES     IN HER 20s stint   TONSILLECTOMY  1981-05-04    Current Outpatient Medications  Medication Sig Dispense Refill   chlordiazePOXIDE (LIBRIUM) 5 MG capsule Take 5 mg by mouth every morning.     eszopiclone (LUNESTA) 2 MG TABS tablet 3 mg.     fluticasone (FLONASE) 50 MCG/ACT nasal spray Place 2 sprays into the nose daily. 16 g 6   Fremanezumab-vfrm (AJOVY) 225 MG/1.5ML SOAJ Inject 225 mg into the skin every 30 (thirty) days. 1 mL 1   HAILEY 24 FE 1-20 MG-MCG(24) tablet TAKE (1) TABLET BY MOUTH ONCE DAILY. 84 tablet 0   loratadine (CLARITIN) 10 MG tablet Take 10 mg by mouth daily.     Multiple Vitamins-Minerals  (MULTIVITAMIN PO) Take by mouth.     No current facility-administered medications for this visit.    Family History  Problem Relation Age of Onset   Cancer Maternal Grandmother    Prostate cancer Maternal Grandfather    Colon cancer Maternal Grandfather    Lung cancer Paternal Grandmother    Diabetes Paternal Grandfather    Ovarian cancer Maternal Aunt        moms identical twin   Other Maternal Aunt    Migraines Neg Hx   MAunt was in her 37's with diagnosis of ovarian cancer, died from it in 2000-05-04. She doesn't think her aunt had genetic testing. Her mom (her identical twin) and her cousins all had negative genetic testing. Mom had her ovaries removed.  MGM died in her 9's.  MGF had colon cancer.   Review of Systems  Endocrine:       Decreased libido  Genitourinary:  Positive for dyspareunia.   Exam:   BP 122/82 (BP Location: Right Arm)    Pulse 74    Resp 18    LMP 01/30/2021 (Approximate)    SpO2 99%   Weight change: @WEIGHTCHANGE @ Height:      Ht Readings from Last 3 Encounters:  01/15/21 5\' 1"  (1.549 m)  01/25/20 5' 1.5" (1.562 m)  01/03/19 5' 1.25" (  1.556 m)    General appearance: alert, cooperative and appears stated age Head: Normocephalic, without obvious abnormality, atraumatic Neck: no adenopathy, supple, symmetrical, trachea midline and thyroid normal to inspection and palpation Lungs: clear to auscultation bilaterally Cardiovascular: regular rate and rhythm Breasts: normal appearance, no masses or tenderness Abdomen: soft, non-tender; non distended,  no masses,  no organomegaly Extremities: extremities normal, atraumatic, no cyanosis or edema Skin: Skin color, texture, turgor normal. No rashes or lesions Lymph nodes: Cervical, supraclavicular, and axillary nodes normal. No abnormal inguinal nodes palpated Neurologic: Grossly normal   Pelvic: External genitalia:  no lesions              Urethra:  normal appearing urethra with no masses, tenderness or  lesions              Bartholins and Skenes: normal                 Vagina: normal appearing vagina with a slight increase in watery, white vaginal d/c              Cervix: no lesions               Bimanual Exam:  Uterus:  normal size, contour, position, consistency, mobility, non-tender              Adnexa: no mass, fullness, tenderness               Rectovaginal: Confirms               Anus:  normal sphincter tone, no lesions  Glorianne Manchester, RN chaperoned for the exam.  1. Well woman exam Discussed breast self exam Discussed calcium and vit D intake No pap this year Mammogram next month Discussed colon cancer screening, she will let me know if she wants me to place a referral for a colonoscopy (looking for provider in [place])  2. Heat intolerance - TSH  3. Lipids abnormal Fasting labs - Lipid panel  4. Encounter for surveillance of contraceptive pills Doing well, wants to continue - Norethindrone Acetate-Ethinyl Estrad-FE (HAILEY 24 FE) 1-20 MG-MCG(24) tablet; TAKE (1) TABLET BY MOUTH ONCE DAILY.  Dispense: 84 tablet; Refill: 3  5. Laboratory exam ordered as part of routine general medical examination - CBC - Comprehensive metabolic panel - Lipid panel  6. Low libido Discussed the multifactorial nature of libido. Information given # to [facility] given  7. Dyspareunia, female Normal exam other than some vaginal d/c Discussed lubrication She should control the rate and depth of penetration  8. Vaginal discharge - WET PREP FOR [place], YEAST, Spry

## 2021-02-21 ENCOUNTER — Ambulatory Visit (INDEPENDENT_AMBULATORY_CARE_PROVIDER_SITE_OTHER): Payer: BC Managed Care – PPO | Admitting: Obstetrics and Gynecology

## 2021-02-21 ENCOUNTER — Encounter: Payer: Self-pay | Admitting: Obstetrics and Gynecology

## 2021-02-21 ENCOUNTER — Other Ambulatory Visit: Payer: Self-pay

## 2021-02-21 VITALS — BP 122/82 | HR 74 | Resp 18

## 2021-02-21 DIAGNOSIS — N941 Unspecified dyspareunia: Secondary | ICD-10-CM

## 2021-02-21 DIAGNOSIS — R6882 Decreased libido: Secondary | ICD-10-CM

## 2021-02-21 DIAGNOSIS — R6889 Other general symptoms and signs: Secondary | ICD-10-CM

## 2021-02-21 DIAGNOSIS — Z0000 Encounter for general adult medical examination without abnormal findings: Secondary | ICD-10-CM

## 2021-02-21 DIAGNOSIS — N898 Other specified noninflammatory disorders of vagina: Secondary | ICD-10-CM

## 2021-02-21 DIAGNOSIS — Z3041 Encounter for surveillance of contraceptive pills: Secondary | ICD-10-CM

## 2021-02-21 DIAGNOSIS — E7889 Other lipoprotein metabolism disorders: Secondary | ICD-10-CM

## 2021-02-21 DIAGNOSIS — Z01419 Encounter for gynecological examination (general) (routine) without abnormal findings: Secondary | ICD-10-CM | POA: Diagnosis not present

## 2021-02-21 LAB — WET PREP FOR TRICH, YEAST, CLUE

## 2021-02-21 MED ORDER — HAILEY 24 FE 1-20 MG-MCG(24) PO TABS: ORAL_TABLET | ORAL | 3 refills | Status: DC

## 2021-02-21 NOTE — Patient Instructions (Addendum)

## 2021-02-22 LAB — COMPREHENSIVE METABOLIC PANEL
AG Ratio: 1.4 (calc) (ref 1.0–2.5)
ALT: 10 U/L (ref 6–29)
AST: 14 U/L (ref 10–30)
Albumin: 4.1 g/dL (ref 3.6–5.1)
Alkaline phosphatase (APISO): 35 U/L (ref 31–125)
BUN/Creatinine Ratio: 14 (calc) (ref 6–22)
BUN: 14 mg/dL (ref 7–25)
CO2: 26 mmol/L (ref 20–32)
Calcium: 9.6 mg/dL (ref 8.6–10.2)
Chloride: 108 mmol/L (ref 98–110)
Creat: 1.02 mg/dL — ABNORMAL HIGH (ref 0.50–0.99)
Globulin: 3 g/dL (calc) (ref 1.9–3.7)
Glucose, Bld: 88 mg/dL (ref 65–99)
Potassium: 5.2 mmol/L (ref 3.5–5.3)
Sodium: 141 mmol/L (ref 135–146)
Total Bilirubin: 0.4 mg/dL (ref 0.2–1.2)
Total Protein: 7.1 g/dL (ref 6.1–8.1)

## 2021-02-22 LAB — LIPID PANEL
Cholesterol: 198 mg/dL (ref <200)
HDL: 52 mg/dL (ref >=50)
LDL Cholesterol (Calc): 121 mg/dL (calc) — ABNORMAL HIGH
Non-HDL Cholesterol (Calc): 146 mg/dL (calc) — ABNORMAL HIGH (ref <130)
Total CHOL/HDL Ratio: 3.8 (calc) (ref <5.0)
Triglycerides: 130 mg/dL (ref <150)

## 2021-02-22 LAB — CBC
HCT: 42.9 % (ref 35.0–45.0)
Hemoglobin: 14.4 g/dL (ref 11.7–15.5)
MCH: 31.2 pg (ref 27.0–33.0)
MCHC: 33.6 g/dL (ref 32.0–36.0)
MCV: 93.1 fL (ref 80.0–100.0)
MPV: 11.5 fL (ref 7.5–12.5)
Platelets: 324 10*3/uL (ref 140–400)
RBC: 4.61 10*6/uL (ref 3.80–5.10)
RDW: 11.9 % (ref 11.0–15.0)
WBC: 8.6 10*3/uL (ref 3.8–10.8)

## 2021-02-22 LAB — TSH: TSH: 2.04 mIU/L

## 2021-02-26 ENCOUNTER — Other Ambulatory Visit: Payer: Self-pay | Admitting: *Deleted

## 2021-02-26 DIAGNOSIS — R944 Abnormal results of kidney function studies: Secondary | ICD-10-CM

## 2021-03-22 ENCOUNTER — Other Ambulatory Visit: Payer: BC Managed Care – PPO

## 2021-04-15 DIAGNOSIS — J029 Acute pharyngitis, unspecified: Secondary | ICD-10-CM | POA: Diagnosis not present

## 2021-04-30 ENCOUNTER — Telehealth: Payer: BC Managed Care – PPO | Admitting: Adult Health

## 2021-05-21 ENCOUNTER — Telehealth: Payer: BC Managed Care – PPO | Admitting: Adult Health

## 2021-06-24 ENCOUNTER — Encounter: Payer: Self-pay | Admitting: Neurology

## 2021-06-26 MED ORDER — AJOVY 225 MG/1.5ML ~~LOC~~ SOAJ: SUBCUTANEOUS | 0 refills | Status: DC

## 2021-06-26 NOTE — Addendum Note (Signed)
Addended by: Guy Begin on: 06/26/2021 09:58 AM   Modules accepted: Orders

## 2021-08-13 ENCOUNTER — Telehealth: Payer: Self-pay | Admitting: Adult Health

## 2021-09-06 ENCOUNTER — Telehealth: Payer: Self-pay | Admitting: Adult Health

## 2021-12-09 ENCOUNTER — Other Ambulatory Visit: Payer: Self-pay | Admitting: Obstetrics and Gynecology

## 2021-12-09 DIAGNOSIS — Z3041 Encounter for surveillance of contraceptive pills: Secondary | ICD-10-CM

## 2022-01-23 ENCOUNTER — Telehealth: Payer: Self-pay | Admitting: Neurology

## 2022-01-23 NOTE — Telephone Encounter (Signed)
Pt understands that although there may be some limitations with this type of visit, we will take all precautions to reduce any security or privacy concerns.  Pt understands that this will be treated like an in office visit and we will file with pt's insurance, and there may be a patient responsible charge related to this service.  Also pt states re: her insurance she will have new insurance in Feb, she will call in and provide new insurance information at that time.

## 2022-01-23 NOTE — Telephone Encounter (Signed)
Noted  

## 2022-02-26 NOTE — Progress Notes (Deleted)
47 y.o. G0P0000 Married White or Caucasian Not Hispanic or Latino female here for annual exam.      No LMP recorded.          Sexually active: {yes no:314532}  The current method of family planning is {contraception:315051}.    Exercising: {yes no:314532}  {types:19826} Smoker:  {YES V2345720  Health Maintenance: Pap:  12/25/17- WNL, HPV- neg; 10/11/2010- WNL  History of abnormal Pap:  no MMG:  03/20/20 Density C Bi-rads 1 neg  BMD:   n/a Colonoscopy: n/a TDaP:  01/03/19 Gardasil: n/a   reports that she has never smoked. She has never used smokeless tobacco. She reports that she does not drink alcohol and does not use drugs.  Past Medical History:  Diagnosis Date   Anxiety disorder    GERD (gastroesophageal reflux disease)    High cholesterol    Kidney stones    Migraines    Palpitations    S/P endoscopy March 2011   mild gastritis, continue Nexium    Past Surgical History:  Procedure Laterality Date   KIDNEY STONES     IN HER 20s stint   TONSILLECTOMY  1983    Current Outpatient Medications  Medication Sig Dispense Refill   chlordiazePOXIDE (LIBRIUM) 5 MG capsule Take 5 mg by mouth every morning.     eszopiclone (LUNESTA) 2 MG TABS tablet 3 mg.     fluticasone (FLONASE) 50 MCG/ACT nasal spray Place 2 sprays into the nose daily. 16 g 6   Fremanezumab-vfrm (AJOVY) 225 MG/1.5ML SOAJ Inject 225 mg into the skin every 30 (thirty) days. 1 mL 1   Fremanezumab-vfrm (AJOVY) 225 MG/1.5ML SOAJ Inject into skin every 30 days.  LOT  [id number] exp 11/2023   NDC [id number] 3.36 mL 0   HAILEY 24 FE 1-20 MG-MCG(24) tablet TAKE (1) TABLET BY MOUTH ONCE DAILY. 84 tablet 0   loratadine (CLARITIN) 10 MG tablet Take 10 mg by mouth daily.     Multiple Vitamins-Minerals (MULTIVITAMIN PO) Take by mouth.     No current facility-administered medications for this visit.    Family History  Problem Relation Age of Onset   Cancer Maternal Grandmother    Prostate cancer Maternal Grandfather     Colon cancer Maternal Grandfather    Lung cancer Paternal Grandmother    Diabetes Paternal Grandfather    Ovarian cancer Maternal Aunt        moms identical twin   Other Maternal Aunt    Migraines Neg Hx     Review of Systems  Exam:   There were no vitals taken for this visit.  Weight change: '@WEIGHTCHANGE'$ @ Height:      Ht Readings from Last 3 Encounters:  01/15/21 '5\' 1"'$  (1.549 m)  01/25/20 5' 1.5" (1.562 m)  01/03/19 5' 1.25" (1.556 m)    General appearance: alert, cooperative and appears stated age Head: Normocephalic, without obvious abnormality, atraumatic Neck: no adenopathy, supple, symmetrical, trachea midline and thyroid {CHL AMB PHY EX THYROID NORM DEFAULT:[phone]::"normal to inspection and palpation"} Lungs: clear to auscultation bilaterally Cardiovascular: regular rate and rhythm Breasts: {Exam; breast:13139::"normal appearance, no masses or tenderness"} Abdomen: soft, non-tender; non distended,  no masses,  no organomegaly Extremities: extremities normal, atraumatic, no cyanosis or edema Skin: Skin color, texture, turgor normal. No rashes or lesions Lymph nodes: Cervical, supraclavicular, and axillary nodes normal. No abnormal inguinal nodes palpated Neurologic: Grossly normal   Pelvic: External genitalia:  no lesions  Urethra:  normal appearing urethra with no masses, tenderness or lesions              Bartholins and Skenes: normal                 Vagina: normal appearing vagina with normal color and discharge, no lesions              Cervix: {CHL AMB PHY EX CERVIX NORM DEFAULT:[phone]::"no lesions"}               Bimanual Exam:  Uterus:  {CHL AMB PHY EX UTERUS NORM [phone]::"normal size, contour, position, consistency, mobility, non-tender"}              Adnexa: {CHL AMB PHY EX ADNEXA NO MASS DEFAULT:[phone]::"no mass, fullness, tenderness"}               Rectovaginal: Confirms               Anus:  normal sphincter tone, no  lesions  *** chaperoned for the exam.  A:  Well Woman with normal exam  P:

## 2022-03-03 ENCOUNTER — Telehealth: Payer: Self-pay | Admitting: *Deleted

## 2022-03-03 ENCOUNTER — Encounter: Payer: Self-pay | Admitting: Adult Health

## 2022-03-03 NOTE — Telephone Encounter (Signed)
Pt states she has new insurance and needs Ajovy PA. She reports it still works well for you (see mychart encounter if needed).

## 2022-03-06 ENCOUNTER — Other Ambulatory Visit (HOSPITAL_COMMUNITY): Payer: Self-pay

## 2022-03-06 ENCOUNTER — Ambulatory Visit: Payer: Self-pay | Admitting: Obstetrics and Gynecology

## 2022-03-06 NOTE — Telephone Encounter (Signed)
Pharmacy Patient Advocate Encounter   Received notification from [place] that prior authorization for AJOVY (fremanezumab-vfrm) injection '225MG'$ /1.5ML auto-injectors is required/requested.   PA submitted on 03/06/2022 to (ins) [employer] via CoverMyMeds Key # [id number] Status is pending

## 2022-03-12 MED ORDER — AJOVY 225 MG/1.5ML ~~LOC~~ SOAJ: 225.0000 mg | SUBCUTANEOUS | 0 refills | Status: DC

## 2022-03-12 NOTE — Addendum Note (Signed)
Addended by: Gildardo Griffes on: 03/12/2022 10:13 AM   Modules accepted: Orders

## 2022-03-12 NOTE — Telephone Encounter (Signed)
Patient Advocate Encounter  Prior Authorization for AJOVY (fremanezumab-vfrm) injection '225MG'$ /1.5ML auto-injectors  has been approved.     Insurance [employer] Effective dates: 03/08/2022 through 03/08/2023      Lyndel Safe, [place] Patient Advocate Specialist [place] Patient Advocate Team Direct Number: [phone]  Fax: [phone]

## 2022-03-28 ENCOUNTER — Ambulatory Visit: Payer: Self-pay | Admitting: Obstetrics and Gynecology

## 2022-04-14 ENCOUNTER — Other Ambulatory Visit: Payer: Self-pay | Admitting: Neurology

## 2022-04-14 NOTE — Progress Notes (Signed)
47 y.o. G0P0000 Married White or Caucasian Not Hispanic or Latino female here for annual exam.  She is on OCP's. She sometimes has headaches with her cycles.  Period Cycle (Days): 28 Period Duration (Days): 3 Period Pattern: Regular Menstrual Flow: Light Menstrual Control: Thin pad Dysmenorrhea: (!) Mild Sexually active, sometimes has entry dyspareunia, +/- helped with lubricant.   She just started a pre and pro biotic for her bowel. Has had issues with gas and bloating. Bowels are normal.   Patient's last menstrual period was 04/01/2022.          Sexually active: Yes.    The current method of family planning is OCP (estrogen/progesterone).    Exercising: No.   Smoker:  no  Health Maintenance: Pap:  12/25/17- WNL, HPV- neg; 10/11/2010- WNL  History of abnormal Pap:  no MMG:  03/19/20 Breast Density Category C, BI-RADS CAT 1 neg BMD:   n/a Colonoscopy: n/a TDaP:  01/03/19 Gardasil: n/a   reports that she has never smoked. She has never used smokeless tobacco. She reports that she does not drink alcohol and does not use drugs.  Past Medical History:  Diagnosis Date   Anxiety disorder    GERD (gastroesophageal reflux disease)    High cholesterol    Kidney stones    Migraine without aura    Migraines    Palpitations    S/P endoscopy 03/06/2009   mild gastritis, continue Nexium    Past Surgical History:  Procedure Laterality Date   KIDNEY STONES     IN HER 20s stint   TONSILLECTOMY  1983    Current Outpatient Medications  Medication Sig Dispense Refill   Ascorbic Acid (VITAMIN C GUMMIE PO) Take by mouth.     Bacillus Coagulans-Inulin (PROBIOTIC-PREBIOTIC) 1-250 BILLION-MG CAPS Take by mouth.     chlordiazePOXIDE (LIBRIUM) 5 MG capsule Take 5 mg by mouth every morning.     eszopiclone (LUNESTA) 2 MG TABS tablet 3 mg.     fluticasone (FLONASE) 50 MCG/ACT nasal spray Place 2 sprays into the nose daily. 16 g 6   Fremanezumab-vfrm (AJOVY) 225 MG/1.5ML SOAJ Inject 225 mg  into the skin every 30 (thirty) days. 1 mL 1   HAILEY 24 FE 1-20 MG-MCG(24) tablet TAKE (1) TABLET BY MOUTH ONCE DAILY. 84 tablet 0   loratadine (CLARITIN) 10 MG tablet Take 10 mg by mouth daily.     Multiple Vitamins-Minerals (MULTIVITAMIN PO) Take by mouth.     No current facility-administered medications for this visit.    Family History  Problem Relation Age of Onset   Cancer Maternal Grandmother    Prostate cancer Maternal Grandfather    Colon cancer Maternal Grandfather    Lung cancer Paternal Grandmother    Diabetes Paternal Grandfather    Ovarian cancer Maternal Aunt        moms identical twin   Other Maternal Aunt    Migraines Neg Hx     Review of Systems  All other systems reviewed and are negative.   Exam:   BP 124/86 (BP Location: Right Arm, Patient Position: Sitting, Cuff Size: Normal)   Pulse 75   Ht 5' 1.5" (1.562 m)   Wt 131 lb (59.4 kg)   LMP 04/01/2022   SpO2 95%   BMI 24.35 kg/m   Weight change: @ Height:   Height: 5' 1.5" (156.2 cm)  Ht Readings from Last 3 Encounters:  04/25/22 5' 1.5" (1.562 m)  01/15/21  (1.549 m)  01/25/20 5' 1.5" (  1.562 m)  Repeat BP 126/84  General appearance: alert, cooperative and appears stated age Head: Normocephalic, without obvious abnormality, atraumatic Neck: no adenopathy, supple, symmetrical, trachea midline and thyroid normal to inspection and palpation Lungs: clear to auscultation bilaterally Cardiovascular: regular rate and rhythm Breasts: normal appearance, no masses or tenderness Abdomen: soft, non-tender; non distended,  no masses,  no organomegaly Extremities: extremities normal, atraumatic, no cyanosis or edema Skin: Skin color, texture, turgor normal. No rashes or lesions Lymph nodes: Cervical, supraclavicular, and axillary nodes normal. No abnormal inguinal nodes palpated Neurologic: Grossly normal   Pelvic: External genitalia:  no lesions              Urethra:  normal appearing  urethra with no masses, tenderness or lesions              Bartholins and Skenes: normal                 Vagina: normal appearing vagina with normal color and discharge, no lesions              Cervix: no lesions               Bimanual Exam:  Uterus:  normal size, contour, position, consistency, mobility, non-tender              Adnexa: no mass, fullness, tenderness               Rectovaginal: Confirms               Anus:  normal sphincter tone, no lesions  Kristin Bruins, chaperoned for the exam.   1. Well woman exam Discussed breast self exam Discussed calcium and vit D intake Mammogram overdue, she will schedule  2. Screening for cervical cancer - Cytology - PAP  3. Lipids abnormal - Lipid panel  4. Abnormal renal function test - COMPLETE METABOLIC PANEL WITH eGFR  5. Encounter for surveillance of contraceptive pills We discussed the option of changing to loseasonique - Norethindrone Acetate-Ethinyl Estrad-FE (HAILEY 24 FE) 1-20 MG-MCG(24) tablet; Take 1 tablet by mouth daily.  Dispense: 84 tablet; Refill: 3  6. Colon cancer screening - Ambulatory referral to Gastroenterology

## 2022-04-17 ENCOUNTER — Other Ambulatory Visit: Payer: Self-pay | Admitting: Neurology

## 2022-04-18 MED ORDER — AJOVY 225 MG/1.5ML ~~LOC~~ SOAJ: 225.0000 mg | SUBCUTANEOUS | 0 refills | Status: DC

## 2022-04-21 ENCOUNTER — Other Ambulatory Visit: Payer: Self-pay | Admitting: *Deleted

## 2022-04-21 MED ORDER — AJOVY 225 MG/1.5ML ~~LOC~~ SOAJ: 225.0000 mg | SUBCUTANEOUS | 1 refills | Status: DC

## 2022-04-25 ENCOUNTER — Encounter: Payer: Self-pay | Admitting: Obstetrics and Gynecology

## 2022-04-25 ENCOUNTER — Ambulatory Visit (INDEPENDENT_AMBULATORY_CARE_PROVIDER_SITE_OTHER): Payer: Managed Care, Other (non HMO) | Admitting: Obstetrics and Gynecology

## 2022-04-25 ENCOUNTER — Other Ambulatory Visit (HOSPITAL_COMMUNITY)
Admission: RE | Admit: 2022-04-25 | Discharge: 2022-04-25 | Disposition: A | Discharge status: Home / Self Care | Payer: Managed Care, Other (non HMO) | Source: Ambulatory Visit | Attending: Obstetrics and Gynecology | Admitting: Obstetrics and Gynecology

## 2022-04-25 ENCOUNTER — Other Ambulatory Visit (HOSPITAL_COMMUNITY): Payer: Self-pay | Admitting: Obstetrics and Gynecology

## 2022-04-25 VITALS — BP 126/84 | HR 75 | Ht 61.5 in | Wt 131.0 lb

## 2022-04-25 DIAGNOSIS — R944 Abnormal results of kidney function studies: Secondary | ICD-10-CM | POA: Diagnosis not present

## 2022-04-25 DIAGNOSIS — Z3041 Encounter for surveillance of contraceptive pills: Secondary | ICD-10-CM

## 2022-04-25 DIAGNOSIS — Z124 Encounter for screening for malignant neoplasm of cervix: Secondary | ICD-10-CM

## 2022-04-25 DIAGNOSIS — E7889 Other lipoprotein metabolism disorders: Secondary | ICD-10-CM

## 2022-04-25 DIAGNOSIS — Z01419 Encounter for gynecological examination (general) (routine) without abnormal findings: Secondary | ICD-10-CM | POA: Diagnosis not present

## 2022-04-25 DIAGNOSIS — Z1211 Encounter for screening for malignant neoplasm of colon: Secondary | ICD-10-CM

## 2022-04-25 DIAGNOSIS — Z1231 Encounter for screening mammogram for malignant neoplasm of breast: Secondary | ICD-10-CM

## 2022-04-25 MED ORDER — HAILEY 24 FE 1-20 MG-MCG(24) PO TABS: 1.0000 | ORAL_TABLET | Freq: Every day | ORAL | 3 refills | Status: DC

## 2022-04-25 NOTE — Patient Instructions (Signed)
Call if you want to try the 3 month pill (loseasonique)  EXERCISE   We recommended that you start or continue a regular exercise program for good health. Physical activity is anything that gets your body moving, some is better than none. The CDC recommends 150 minutes per week of Moderate-Intensity Aerobic Activity and 2 or more days of Muscle Strengthening Activity.  Benefits of exercise are limitless: helps weight loss/weight maintenance, improves mood and energy, helps with depression and anxiety, improves sleep, tones and strengthens muscles, improves balance, improves bone density, protects from chronic conditions such as heart disease, high blood pressure and diabetes and so much more. To learn more visit: http://kirby-bean.org/  DIET: Good nutrition starts with a healthy diet of fruits, vegetables, whole grains, and lean protein sources. Drink plenty of water for hydration. Minimize empty calories, sodium, sweets. For more information about dietary recommendations visit: CriticalGas.be and https://www.carpenter-henry.info/  ALCOHOL:  Women should limit their alcohol intake to no more than 7 drinks/beers/glasses of wine (combined, not each!) per week. Moderation of alcohol intake to this level decreases your risk of breast cancer and liver damage.  If you are concerned that you may have a problem, or your friends have told you they are concerned about your drinking, there are many resources to help. A well-known program that is free, effective, and available to all people all over the nation is Alcoholics Anonymous.  Check out this site to learn more: BeverageBargains.co.za   CALCIUM AND VITAMIN D:  Adequate intake of calcium and Vitamin D are recommended for bone health.  You should be getting between 1000-1200 mg of calcium and 800 units of Vitamin D daily between diet and supplements  PAP SMEARS:  Pap smears, to check for  cervical cancer or precancers,  have traditionally been done yearly, scientific advances have shown that most women can have pap smears less often.  However, every woman still should have a physical exam from her gynecologist every year. It will include a breast check, inspection of the vulva and vagina to check for abnormal growths or skin changes, a visual exam of the cervix, and then an exam to evaluate the size and shape of the uterus and ovaries. We will also provide age appropriate advice regarding health maintenance, like when you should have certain vaccines, screening for sexually transmitted diseases, bone density testing, colonoscopy, mammograms, etc.   MAMMOGRAMS:  All women over 76 years old should have a routine mammogram.   COLON CANCER SCREENING: Now recommend starting at age 76. At this time colonoscopy is not covered for routine screening until 50. There are take home tests that can be done between 45-49.   COLONOSCOPY:  Colonoscopy to screen for colon cancer is recommended for all women at age 1.  We know, you hate the idea of the prep.  We agree, BUT, having colon cancer and not knowing it is worse!!  Colon cancer so often starts as a polyp that can be seen and removed at colonscopy, which can quite literally save your life!  And if your first colonoscopy is normal and you have no family history of colon cancer, most women don't have to have it again for 10 years.  Once every ten years, you can do something that may end up saving your life, right?  We will be happy to help you get it scheduled when you are ready.  Be sure to check your insurance coverage so you understand how much it will cost.  It may be  covered as a preventative service at no cost, but you should check your particular policy.      Breast Self-Awareness Breast self-awareness means being familiar with how your breasts look and feel. It involves checking your breasts regularly and reporting any changes to your health  care provider. Practicing breast self-awareness is important. A change in your breasts can be a sign of a serious medical problem. Being familiar with how your breasts look and feel allows you to find any problems early, when treatment is more likely to be successful. All women should practice breast self-awareness, including women who have had breast implants. How to do a breast self-exam One way to learn what is normal for your breasts and whether your breasts are changing is to do a breast self-exam. To do a breast self-exam: Look for Changes  Remove all the clothing above your waist. Stand in front of a mirror in a room with good lighting. Put your hands on your hips. Push your hands firmly downward. Compare your breasts in the mirror. Look for differences between them (asymmetry), such as: Differences in shape. Differences in size. Puckers, dips, and bumps in one breast and not the other. Look at each breast for changes in your skin, such as: Redness. Scaly areas. Look for changes in your nipples, such as: Discharge. Bleeding. Dimpling. Redness. A change in position. Feel for Changes Carefully feel your breasts for lumps and changes. It is best to do this while lying on your back on the floor and again while sitting or standing in the shower or tub with soapy water on your skin. Feel each breast in the following way: Place the arm on the side of the breast you are examining above your head. Feel your breast with the other hand. Start in the nipple area and make  inch (2 cm) overlapping circles to feel your breast. Use the pads of your three middle fingers to do this. Apply light pressure, then medium pressure, then firm pressure. The light pressure will allow you to feel the tissue closest to the skin. The medium pressure will allow you to feel the tissue that is a little deeper. The firm pressure will allow you to feel the tissue close to the ribs. Continue the overlapping circles,  moving downward over the breast until you feel your ribs below your breast. Move one finger-width toward the center of the body. Continue to use the  inch (2 cm) overlapping circles to feel your breast as you move slowly up toward your collarbone. Continue the up and down exam using all three pressures until you reach your armpit.  Write Down What You Find  Write down what is normal for each breast and any changes that you find. Keep a written record with breast changes or normal findings for each breast. By writing this information down, you do not need to depend only on memory for size, tenderness, or location. Write down where you are in your menstrual cycle, if you are still menstruating. If you are having trouble noticing differences in your breasts, do not get discouraged. With time you will become more familiar with the variations in your breasts and more comfortable with the exam. How often should I examine my breasts? Examine your breasts every month. If you are breastfeeding, the best time to examine your breasts is after a feeding or after using a breast pump. If you menstruate, the best time to examine your breasts is 5-7 days after your period is over.  During your period, your breasts are lumpier, and it may be more difficult to notice changes. When should I see my health care provider? See your health care provider if you notice: A change in shape or size of your breasts or nipples. A change in the skin of your breast or nipples, such as a reddened or scaly area. Unusual discharge from your nipples. A lump or thick area that was not there before. Pain in your breasts. Anything that concerns you.

## 2022-04-26 LAB — COMPLETE METABOLIC PANEL WITH GFR
AG Ratio: 1.6 (calc) (ref 1.0–2.5)
ALT: 12 U/L (ref 6–29)
AST: 17 U/L (ref 10–35)
Albumin: 4.1 g/dL (ref 3.6–5.1)
Alkaline phosphatase (APISO): 39 U/L (ref 31–125)
BUN: 13 mg/dL (ref 7–25)
CO2: 24 mmol/L (ref 20–32)
Calcium: 9.7 mg/dL (ref 8.6–10.2)
Chloride: 104 mmol/L (ref 98–110)
Creat: 0.97 mg/dL (ref 0.50–0.99)
Globulin: 2.5 g/dL (calc) (ref 1.9–3.7)
Glucose, Bld: 83 mg/dL (ref 65–99)
Potassium: 4.2 mmol/L (ref 3.5–5.3)
Sodium: 138 mmol/L (ref 135–146)
Total Bilirubin: 0.3 mg/dL (ref 0.2–1.2)
Total Protein: 6.6 g/dL (ref 6.1–8.1)
eGFR: 73 mL/min/{1.73_m2} (ref >=60)

## 2022-04-26 LAB — LIPID PANEL
Cholesterol: 198 mg/dL (ref <200)
HDL: 49 mg/dL — ABNORMAL LOW (ref >=50)
LDL Cholesterol (Calc): 114 mg/dL (calc) — ABNORMAL HIGH
Non-HDL Cholesterol (Calc): 149 mg/dL (calc) — ABNORMAL HIGH (ref <130)
Total CHOL/HDL Ratio: 4 (calc) (ref <5.0)
Triglycerides: 228 mg/dL — ABNORMAL HIGH (ref <150)

## 2022-05-01 LAB — CYTOLOGY - PAP
Comment: NEGATIVE
Diagnosis: NEGATIVE
High risk HPV: NEGATIVE

## 2022-05-02 ENCOUNTER — Telehealth (INDEPENDENT_AMBULATORY_CARE_PROVIDER_SITE_OTHER): Payer: Managed Care, Other (non HMO) | Admitting: Adult Health

## 2022-05-02 DIAGNOSIS — G43709 Chronic migraine without aura, not intractable, without status migrainosus: Secondary | ICD-10-CM | POA: Diagnosis not present

## 2022-05-02 MED ORDER — AJOVY 225 MG/1.5ML ~~LOC~~ SOAJ: 225.0000 mg | SUBCUTANEOUS | 14 refills | Status: DC

## 2022-05-02 NOTE — Progress Notes (Signed)
PATIENT: Bridget Parker DOB: 07-22-1975  REASON FOR VISIT: follow up HISTORY FROM: patient  Virtual Visit via Video Note  I connected with Bridget Parker on 05/02/22 at  9:00 AM EDT by a video enabled telemedicine application located remotely at [facility] and verified that I am speaking with the correct person using two identifiers who was located at their own home.   I discussed the limitations of evaluation and management by telemedicine and the availability of in person appointments. The patient expressed understanding and agreed to proceed.   PATIENT: Bridget Parker DOB: 04-10-1975  REASON FOR VISIT: follow up HISTORY FROM: patient  HISTORY OF PRESENT ILLNESS: Today 05/02/22:  Bridget Parker is a 47 y.o. female with a history of Migraines. Returns today for follow-up. Remains on Ajovy. Working well! Reports that she have 2-3 migraines a month if that. Usually has a migraine around her menstrual cycle. Has tried Relapax- has only taken it a couple of times and feels that it did help. Usually uses OTC medication.   HISTORY (copied from Dr. Trevor Mace note) 01/15/2021: Her insurance has changed. His new insurance will not happen until April. Ajovy helps tremendously. She went to get a refill for this month, it was approved but it ws going to be $600 because of her deductable. We are trying the new copay card, we provided the Ajovy foundation, and we are calling the pharmact to see, she has done so well on Ajovy would be a shame, giving 4 months samples until new insurance in April. No other focal neurologic deficits, associated symptoms, inciting events or modifiable factors.    REVIEW OF SYSTEMS: Out of a complete 14 system review of symptoms, the patient complains only of the following symptoms, and all other reviewed systems are negative.  ALLERGIES: Allergies  Allergen Reactions   Sulfonamide Derivatives     HOME MEDICATIONS: Outpatient Medications  Prior to Visit  Medication Sig Dispense Refill   Ascorbic Acid (VITAMIN C GUMMIE PO) Take by mouth.     Bacillus Coagulans-Inulin (PROBIOTIC-PREBIOTIC) 1-250 BILLION-MG CAPS Take by mouth.     chlordiazePOXIDE (LIBRIUM) 5 MG capsule Take 5 mg by mouth every morning.     eszopiclone (LUNESTA) 2 MG TABS tablet 3 mg.     fluticasone (FLONASE) 50 MCG/ACT nasal spray Place 2 sprays into the nose daily. 16 g 6   Fremanezumab-vfrm (AJOVY) 225 MG/1.5ML SOAJ Inject 225 mg into the skin every 30 (thirty) days. 1 mL 1   loratadine (CLARITIN) 10 MG tablet Take 10 mg by mouth daily.     Multiple Vitamins-Minerals (MULTIVITAMIN PO) Take by mouth.     Norethindrone Acetate-Ethinyl Estrad-FE (HAILEY 24 FE) 1-20 MG-MCG(24) tablet Take 1 tablet by mouth daily. 84 tablet 3   No facility-administered medications prior to visit.    PAST MEDICAL HISTORY: Past Medical History:  Diagnosis Date   Anxiety disorder    GERD (gastroesophageal reflux disease)    High cholesterol    Kidney stones    Migraine without aura    Migraines    Palpitations    S/P endoscopy 03/06/2009   mild gastritis, continue Nexium    PAST SURGICAL HISTORY: Past Surgical History:  Procedure Laterality Date   KIDNEY STONES     IN HER 20s stint   TONSILLECTOMY  1983    FAMILY HISTORY: Family History  Problem Relation Age of Onset   Cancer Maternal Grandmother    Prostate cancer Maternal Grandfather  Colon cancer Maternal Grandfather    Lung cancer Paternal Grandmother    Diabetes Paternal Grandfather    Ovarian cancer Maternal Aunt        moms identical twin   Other Maternal Aunt    Migraines Neg Hx     SOCIAL HISTORY: Social History   Socioeconomic History   Marital status: Married    Spouse name: Gerri Spore    Number of children: 0   Years of education: some colle   Highest education level: Not on file  Occupational History   Occupation: [employer]  Tobacco Use   Smoking status: Never    Smokeless tobacco: Never  Vaping Use   Vaping Use: Never used  Substance and Sexual Activity   Alcohol use: No   Drug use: No   Sexual activity: Yes    Birth control/protection: OCP  Other Topics Concern   Not on file  Social History Narrative   Lives with husband   Caffeine use: 1 cup daily (coffee), occasional soda, mostly water   Right-handed   Social Determinants of Health   Financial Resource Strain: Not on file  Food Insecurity: Not on file  Transportation Needs: Not on file  Physical Activity: Not on file  Stress: Not on file  Social Connections: Not on file  Intimate Partner Violence: Not on file      PHYSICAL EXAM Generalized: Well developed, in no acute distress   Neurological examination  Mentation: Alert oriented to time, place, history taking. Follows all commands speech and language fluent Cranial nerve II-XII: Facial symmetry noted  DIAGNOSTIC DATA (LABS, IMAGING, TESTING) - I reviewed patient records, labs, notes, testing and imaging myself where available.  Lab Results  Component Value Date   WBC 8.6 02/21/2021   HGB 14.4 02/21/2021   HCT 42.9 02/21/2021   MCV 93.1 02/21/2021   PLT 324 02/21/2021      Component Value Date/Time   NA 138 04/25/2022 0921   NA 140 01/03/2019 1000   K 4.2 04/25/2022 0921   CL 104 04/25/2022 0921   CO2 24 04/25/2022 0921   GLUCOSE 83 04/25/2022 0921   BUN 13 04/25/2022 0921   BUN 13 01/03/2019 1000   CREATININE 0.97 04/25/2022 0921   CALCIUM 9.7 04/25/2022 0921   PROT 6.6 04/25/2022 0921   PROT 6.7 01/03/2019 1000   ALBUMIN 3.9 01/03/2019 1000   AST 17 04/25/2022 0921   ALT 12 04/25/2022 0921   ALKPHOS 42 01/03/2019 1000   BILITOT 0.3 04/25/2022 0921   BILITOT <0.2 01/03/2019 1000   GFRNONAA 96 01/03/2019 1000   GFRAA 110 01/03/2019 1000   Lab Results  Component Value Date   CHOL 198 04/25/2022   HDL 49 (L) 04/25/2022   LDLCALC 114 (H) 04/25/2022   TRIG 228 (H) 04/25/2022   CHOLHDL 4.0 04/25/2022    No results found for: "HGBA1C" No results found for: "VITAMINB12" Lab Results  Component Value Date   TSH 2.04 02/21/2021      ASSESSMENT AND PLAN 47 y.o. year old female  has a past medical history of Anxiety disorder, GERD (gastroesophageal reflux disease), High cholesterol, Kidney stones, Migraine without aura, Migraines, Palpitations, and S/P endoscopy (03/06/2009). here with:  Migraines   Continue Ajovy monthly injections  Continue Relapax if needed for abortive therapy.  FU in 1 year or sooner if needed       Butch Penny, MSN, NP-C 05/02/2022, 8:56 AM [facility] [address], [address] [place], [place] [address] [phone])  [phone]  

## 2022-05-06 ENCOUNTER — Encounter: Payer: Self-pay | Admitting: Obstetrics and Gynecology

## 2022-05-07 ENCOUNTER — Encounter: Payer: Self-pay | Admitting: Adult Health

## 2022-05-09 ENCOUNTER — Ambulatory Visit (HOSPITAL_COMMUNITY)
Admission: RE | Admit: 2022-05-09 | Discharge: 2022-05-09 | Disposition: A | Discharge status: Home / Self Care | Payer: Managed Care, Other (non HMO) | Source: Ambulatory Visit | Attending: Obstetrics and Gynecology | Admitting: Obstetrics and Gynecology

## 2022-05-09 DIAGNOSIS — Z1231 Encounter for screening mammogram for malignant neoplasm of breast: Secondary | ICD-10-CM | POA: Diagnosis present

## 2022-05-16 NOTE — Telephone Encounter (Signed)
Msg sent to appt desk.  

## 2022-05-16 NOTE — Telephone Encounter (Signed)
OK to advise pt that AZO is not an abx and will only treat her sxs and not the bacteria? Also, please advise on the other inquiry, OV ok here? Thanks.

## 2022-05-19 ENCOUNTER — Encounter: Payer: Self-pay | Admitting: Obstetrics and Gynecology

## 2022-05-19 ENCOUNTER — Encounter: Payer: Self-pay | Admitting: Gastroenterology

## 2022-05-19 NOTE — Telephone Encounter (Signed)
FYI. Pt scheduled for 05/22/2022.

## 2022-05-19 NOTE — Telephone Encounter (Signed)
FYI. Pt scheduled for 05/22/2022. Will route to provider for final review and close encounter.

## 2022-05-22 ENCOUNTER — Ambulatory Visit: Payer: Managed Care, Other (non HMO) | Admitting: Obstetrics and Gynecology

## 2022-05-22 ENCOUNTER — Encounter: Payer: Self-pay | Admitting: Obstetrics and Gynecology

## 2022-05-22 VITALS — BP 130/84 | HR 91 | Ht 61.5 in | Wt 131.0 lb

## 2022-05-22 DIAGNOSIS — K649 Unspecified hemorrhoids: Secondary | ICD-10-CM

## 2022-05-22 DIAGNOSIS — Z87440 Personal history of urinary (tract) infections: Secondary | ICD-10-CM

## 2022-05-22 LAB — URINALYSIS, COMPLETE W/RFL CULTURE
Bacteria, UA: NONE SEEN /HPF
Bilirubin Urine: NEGATIVE
Glucose, UA: NEGATIVE
Hgb urine dipstick: NEGATIVE
Hyaline Cast: NONE SEEN /LPF
Ketones, ur: NEGATIVE
Leukocyte Esterase: NEGATIVE
Nitrites, Initial: NEGATIVE
Protein, ur: NEGATIVE
RBC / HPF: NONE SEEN /HPF (ref 0–2)
Specific Gravity, Urine: 1.01 (ref 1.001–1.035)
WBC, UA: NONE SEEN /HPF (ref 0–5)
pH: 7 (ref 5.0–8.0)

## 2022-05-22 LAB — NO CULTURE INDICATED

## 2022-05-22 MED ORDER — HYDROCORTISONE (PERIANAL) 2.5 % EX CREA
TOPICAL_CREAM | Freq: Two times a day (BID) | CUTANEOUS | 1 refills | Status: AC
Start: 2022-05-22 — End: ?

## 2022-05-22 NOTE — Progress Notes (Deleted)
GYNECOLOGY  VISIT   HPI: 47 y.o.   Married White or Caucasian Not Hispanic or Latino  female   G0P0000 with Patient's last menstrual period was 04/30/2022 (approximate).   here for   hemorrhoid   GYNECOLOGIC HISTORY: Patient's last menstrual period was 04/30/2022 (approximate). Contraception:*** Menopausal hormone therapy: ***        OB History     Gravida  0   Para  0   Term  0   Preterm  0   AB  0   Living  0      SAB  0   IAB  0   Ectopic  0   Multiple  0   Live Births  0              Patient Active Problem List   Diagnosis Date Noted   Chronic migraine w/o aura w/o status migrainosus, not intractable 05/16/2015   GERD (gastroesophageal reflux disease) 05/30/2010   DYSPEPSIA 03/16/2009   PALPITATIONS 03/14/2009    Past Medical History:  Diagnosis Date   Anxiety disorder    GERD (gastroesophageal reflux disease)    High cholesterol    Kidney stones    Migraine without aura    Migraines    Palpitations    S/P endoscopy 03/06/2009   mild gastritis, continue Nexium    Past Surgical History:  Procedure Laterality Date   KIDNEY STONES     IN HER 20s stint   TONSILLECTOMY  1983    Current Outpatient Medications  Medication Sig Dispense Refill   Ascorbic Acid (VITAMIN C GUMMIE PO) Take by mouth.     Bacillus Coagulans-Inulin (PROBIOTIC-PREBIOTIC) 1-250 BILLION-MG CAPS Take by mouth.     chlordiazePOXIDE (LIBRIUM) 5 MG capsule Take 5 mg by mouth every morning.     eszopiclone (LUNESTA) 2 MG TABS tablet 3 mg.     fluticasone (FLONASE) 50 MCG/ACT nasal spray Place 2 sprays into the nose daily. 16 g 6   Fremanezumab-vfrm (AJOVY) 225 MG/1.5ML SOAJ Inject 225 mg into the skin every 30 (thirty) days. 1 mL 14   loratadine (CLARITIN) 10 MG tablet Take 10 mg by mouth daily.     Multiple Vitamins-Minerals (MULTIVITAMIN PO) Take by mouth.     Norethindrone Acetate-Ethinyl Estrad-FE (HAILEY 24 FE) 1-20 MG-MCG(24) tablet Take 1 tablet by mouth daily. 84  tablet 3   No current facility-administered medications for this visit.     ALLERGIES: Sulfonamide derivatives  Family History  Problem Relation Age of Onset   Cancer Maternal Grandmother    Prostate cancer Maternal Grandfather    Colon cancer Maternal Grandfather    Lung cancer Paternal Grandmother    Diabetes Paternal Grandfather    Ovarian cancer Maternal Aunt        moms identical twin   Other Maternal Aunt    Migraines Neg Hx     Social History   Socioeconomic History   Marital status: Married    Spouse name: Gerri Spore    Number of children: 0   Years of education: some colle   Highest education level: Not on file  Occupational History   Occupation: [employer]  Tobacco Use   Smoking status: Never   Smokeless tobacco: Never  Vaping Use   Vaping Use: Never used  Substance and Sexual Activity   Alcohol use: No   Drug use: No   Sexual activity: Yes    Birth control/protection: OCP  Other Topics Concern   Not on file  Social History Narrative   Lives with husband   Caffeine use: 1 cup daily (coffee), occasional soda, mostly water   Right-handed   Social Determinants of Health   Financial Resource Strain: Not on file  Food Insecurity: Not on file  Transportation Needs: Not on file  Physical Activity: Not on file  Stress: Not on file  Social Connections: Not on file  Intimate Partner Violence: Not on file    ROS  PHYSICAL EXAMINATION:    LMP 04/30/2022 (Approximate)     General appearance: alert, cooperative and appears stated age Neck: no adenopathy, supple, symmetrical, trachea midline and thyroid {CHL AMB PHY EX THYROID NORM [phone]::"normal to inspection and palpation"} Breasts: {Exam; breast:13139::"normal appearance, no masses or tenderness"} Abdomen: soft, non-tender; non distended, no masses,  no organomegaly  Pelvic: External genitalia:  no lesions              Urethra:  normal appearing urethra with no masses,  tenderness or lesions              Bartholins and Skenes: normal                 Vagina: normal appearing vagina with normal color and discharge, no lesions              Cervix: {CHL AMB PHY EX CERVIX NORM [phone]::"no lesions"}              Bimanual Exam:  Uterus:  {CHL AMB PHY EX UTERUS NORM DEFAULT:[phone]::"normal size, contour, position, consistency, mobility, non-tender"}              Adnexa: {CHL AMB PHY EX ADNEXA NO MASS DEFAULT:[phone]::"no mass, fullness, tenderness"}              Rectovaginal: {yes no:[id number]}.  Confirms.              Anus:  normal sphincter tone, no lesions  Chaperone was present for exam.  ASSESSMENT     PLAN    An After Visit Summary was printed and given to the patient.  *** minutes face to face time of which over 50% was spent in counseling.

## 2022-05-22 NOTE — Progress Notes (Signed)
GYNECOLOGY  VISIT   HPI: 47 y.o.   Married White or Caucasian Not Hispanic or Latino  female   [id number] with Patient's last menstrual period was 04/30/2022 (approximate).   here for   hemorrhoid eval. Pt relieved UTI with AZO, last used AZO 2 weeks ago. No current urinary c/o.  She c/o a tender anal bump a week ago. Not tender currently. After BM on Monday she had some bleeding, the pain has improved. Still has discomfort. She has been using prep. H and recti care since last week. BM daily. Not constipated,.   GYNECOLOGIC HISTORY: Patient's last menstrual period was 04/30/2022 (approximate). Contraception:OCP Menopausal hormone therapy: n/a        OB History     Gravida  0   Para  0   Term  0   Preterm  0   AB  0   Living  0      SAB  0   IAB  0   Ectopic  0   Multiple  0   Live Births  0              Patient Active Problem List   Diagnosis Date Noted   Chronic migraine w/o aura w/o status migrainosus, not intractable 05/16/2015   GERD (gastroesophageal reflux disease) 05/30/2010   DYSPEPSIA 03/16/2009   PALPITATIONS 03/14/2009    Past Medical History:  Diagnosis Date   Anxiety disorder    GERD (gastroesophageal reflux disease)    High cholesterol    Kidney stones    Migraine without aura    Migraines    Palpitations    S/P endoscopy 03/06/2009   mild gastritis, continue Nexium    Past Surgical History:  Procedure Laterality Date   KIDNEY STONES     IN HER 20s stint   TONSILLECTOMY  1983    Current Outpatient Medications  Medication Sig Dispense Refill   Ascorbic Acid (VITAMIN C GUMMIE PO) Take by mouth.     Bacillus Coagulans-Inulin (PROBIOTIC-PREBIOTIC) 1-250 BILLION-MG CAPS Take by mouth.     chlordiazePOXIDE (LIBRIUM) 5 MG capsule Take 5 mg by mouth every morning.     eszopiclone (LUNESTA) 2 MG TABS tablet 3 mg.     fluticasone (FLONASE) 50 MCG/ACT nasal spray Place 2 sprays into the nose daily. 16 g 6   Fremanezumab-vfrm (AJOVY) 225  MG/1.5ML SOAJ Inject 225 mg into the skin every 30 (thirty) days. 1 mL 14   loratadine (CLARITIN) 10 MG tablet Take 10 mg by mouth daily.     Multiple Vitamins-Minerals (MULTIVITAMIN PO) Take by mouth.     Norethindrone Acetate-Ethinyl Estrad-FE (HAILEY 24 FE) 1-20 MG-MCG(24) tablet Take 1 tablet by mouth daily. 84 tablet 3   No current facility-administered medications for this visit.     ALLERGIES: Sulfonamide derivatives  Family History  Problem Relation Age of Onset   Cancer Maternal Grandmother    Prostate cancer Maternal Grandfather    Colon cancer Maternal Grandfather    Lung cancer Paternal Grandmother    Diabetes Paternal Grandfather    Ovarian cancer Maternal Aunt        moms identical twin   Other Maternal Aunt    Migraines Neg Hx     Social History   Socioeconomic History   Marital status: Married    Spouse name: Gerri Spore    Number of children: 0   Years of education: some colle   Highest education level: Not on file  Occupational History   Occupation:   orthodontics  Tobacco Use   Smoking status: Never   Smokeless tobacco: Never  Vaping Use   Vaping Use: Never used  Substance and Sexual Activity   Alcohol use: No   Drug use: No   Sexual activity: Yes    Birth control/protection: OCP  Other Topics Concern   Not on file  Social History Narrative   Lives with husband   Caffeine use: 1 cup daily (coffee), occasional soda, mostly water   Right-handed   Social Determinants of Health   Financial Resource Strain: Not on file  Food Insecurity: Not on file  Transportation Needs: Not on file  Physical Activity: Not on file  Stress: Not on file  Social Connections: Not on file  Intimate Partner Violence: Not on file    Review of Systems  All other systems reviewed and are negative.   PHYSICAL EXAMINATION:    BP 130/84 (BP Location: Right Arm, Patient Position: Sitting, Cuff Size: Normal)   Pulse 91   Ht 5' 1.5" (1.562 m)   Wt 131 lb (59.4  kg)   LMP 04/30/2022 (Approximate)   SpO2 96%   BMI 24.35 kg/m     General appearance: alert, cooperative and appears stated age  Pelvic: External genitalia:  no lesions              Urethra:  normal appearing urethra with no masses, tenderness or lesions              Bartholins and Skenes: normal                      Anus:  small external hemorrhoid noted at 5 o'clock, evidence of spontaneous drainage. No surrounding erythema.   Chaperone was present for exam.  1. Hemorrhoids, unspecified hemorrhoid type Discussed avoiding constipation, not sitting on the toilet for prolonged time. Can use ice, sitz baths - hydrocortisone (ANUSOL-HC) 2.5 % rectal cream; Place rectally 2 (two) times daily.  Dispense: 30 g; Refill: 1 -Reach out if not improving  2. History of UTI Feeling better.  - Urinalysis,Complete w/RFL Culture: normal.

## 2022-05-22 NOTE — Patient Instructions (Signed)
Hemorrhoids Hemorrhoids are swollen veins in and around the rectum or the opening of the butt (anus). There are two types of hemorrhoids: Internal. These occur in the veins just inside the rectum. They may poke through to the outside and become irritated and painful. External. These occur in the veins outside the anus. They can be felt as a painful swelling or hard lump near the anus. Most hemorrhoids do not cause severe problems. Often, they can be treated at home with diet and lifestyle changes. If home treatments do not help, you may need a procedure to shrink or remove the hemorrhoids. What are the causes? Hemorrhoids are caused by pressure near the anus. This pressure may be caused by: Constipation or diarrhea. Straining to poop. Pregnancy. Obesity. Sitting or riding a bike for a long time. Heavy lifting or other things that cause you to strain. Anal sex. What are the signs or symptoms? Symptoms of this condition include: Pain. Anal itching or irritation. Bleeding from the rectum. Leakage of poop (stool). Swelling of the anus. One or more lumps around the anus. How is this diagnosed? Hemorrhoids can often be diagnosed through a visual exam. Other exams or tests may also be done, such as: A digital rectal exam. This is when your health care provider feels inside your rectum with a gloved finger. Anoscope. This is an exam of the anus using a small tube. A blood test, if you have lost a lot of blood. A sigmoidoscopy or colonoscopy. These are tests to look inside the colon using a tube with a camera on the end. How is this treated? In most cases, hemorrhoids can be treated at home with diet and lifestyle changes. If these changes do not help, you may need to have a procedure done. These procedures can make the hemorrhoids smaller or fully remove them. Common procedures include: Rubber band ligation. Rubber bands are placed at the base of the hemorrhoids to cut off their blood  supply. Sclerotherapy. Medicine is put into the hemorrhoids to shrink them. Infrared coagulation. A type of light energy is used to get rid of the hemorrhoids. Hemorrhoidectomy surgery. The hemorrhoids are removed during surgery. Then, the veins that supply them are tied off. Stapled hemorrhoidopexy surgery. The base of the hemorrhoid is stapled to the wall of the rectum. Follow these instructions at home: Medicines Take over-the-counter and prescription medicines only as told by your provider. Use medicated creams or medicines that are put in the rectum (suppositories) as told by your provider. Eating and drinking  Eat foods that are high in fiber, such as beans, whole grains, and fresh fruits and vegetables. Ask your provider about taking products that have fiber added to them (fiber supplements). Reduce the amount of fat in your diet. You can do this by eating low-fat dairy products, eating less red meat, and avoiding processed foods. Drink enough fluid to keep your pee (urine) pale yellow. Managing pain and swelling  Take warm sitz baths for 20 minutes, 3-4 times a day. This can help ease pain and discomfort. You may do this in a bathtub or you can use a portable sitz bath that fits over the toilet. If told, put ice on the affected area. It may help to use ice packs between sitz baths. Put ice in a plastic bag. Place a towel between your skin and the bag. Leave the ice on for 20 minutes, 2-3 times a day. If your skin turns bright red, remove the ice right away to prevent   skin damage. The risk of damage is higher if you cannot feel pain, heat, or cold. General instructions Exercise. Ask your provider how much and what kind of exercise is best for you. In general, you should do moderate exercise for at least 30 minutes on most days of the week (150 minutes each week). You may want to try walking, biking, or yoga. Go to the bathroom when you have the urge to poop. Do not wait. Avoid  straining to poop. Keep the anus dry and clean. Use wet toilet paper or moist towelettes after you poop. Do not sit on the toilet for a long time. This can increase blood pooling and pain. Where to find more information National Institute of Diabetes and Digestive and Kidney Diseases: niddk.nih.gov Contact a health care provider if: You have more pain and swelling that do not get better with treatment. You have trouble pooping or you are not able to poop. You have pain or inflammation outside the area of the hemorrhoids. Get help right away if: You are bleeding from your rectum and you cannot get it to stop. This information is not intended to replace advice given to you by your health care provider. Make sure you discuss any questions you have with your health care provider. Document Revised: 09/04/2021 Document Reviewed: 09/04/2021 Elsevier Patient Education  2023 Elsevier Inc.  

## 2022-08-08 ENCOUNTER — Encounter: Payer: Managed Care, Other (non HMO) | Admitting: Gastroenterology

## 2022-09-02 DIAGNOSIS — F411 Generalized anxiety disorder: Secondary | ICD-10-CM | POA: Diagnosis not present

## 2022-09-02 DIAGNOSIS — F5101 Primary insomnia: Secondary | ICD-10-CM | POA: Diagnosis not present

## 2022-10-01 ENCOUNTER — Encounter: Payer: Self-pay | Admitting: *Deleted

## 2022-10-01 ENCOUNTER — Encounter: Payer: Self-pay | Admitting: Adult Health

## 2022-10-13 ENCOUNTER — Encounter: Payer: Self-pay | Admitting: Adult Health

## 2022-10-16 ENCOUNTER — Other Ambulatory Visit (HOSPITAL_COMMUNITY): Payer: Self-pay

## 2022-10-16 ENCOUNTER — Telehealth: Payer: Self-pay

## 2022-10-16 NOTE — Telephone Encounter (Signed)
Noted  

## 2022-10-16 NOTE — Telephone Encounter (Signed)
Pharmacy Patient Advocate Encounter   Received notification from Patient Advice Request messages that prior authorization for AJOVY (fremanezumab-vfrm) injection 225MG /1.5ML auto-injectors is required/requested.   Insurance verification completed.   The patient is insured through [facility] .   Per test claim: PA required; PA submitted to [facility] via CoverMyMeds Key/confirmation #/EOC [id number] Status is pending

## 2022-10-22 NOTE — Telephone Encounter (Signed)
Pharmacy Patient Advocate Encounter  Received notification from [facility] that Prior Authorization for AJOVY (fremanezumab-vfrm) injection 225MG /1.5ML auto-injectors has been DENIED.  Full denial letter will be uploaded to the media tab. See denial reason below.      PA #/Case ID/Reference #: PA Case ID #: [id number]

## 2022-10-23 MED ORDER — EMGALITY 120 MG/ML ~~LOC~~ SOAJ: 120.0000 mg | SUBCUTANEOUS | 5 refills | Status: DC

## 2022-10-23 NOTE — Telephone Encounter (Signed)
I sent the pt a mychart message back

## 2022-10-23 NOTE — Telephone Encounter (Signed)
Let her know about her insurance

## 2022-10-23 NOTE — Addendum Note (Signed)
Addended by: Bertram Savin on: 10/23/2022 03:54 PM   Modules accepted: Orders

## 2022-10-23 NOTE — Telephone Encounter (Signed)
The patient is okay with trying Emgality.  I have placed the order and canceled the Ajovy.  PA team, are you able to go ahead and run a test claim?

## 2022-10-27 ENCOUNTER — Other Ambulatory Visit (HOSPITAL_COMMUNITY): Payer: Self-pay

## 2022-10-27 NOTE — Telephone Encounter (Signed)
Patient states PA is needed on Emgality.

## 2022-10-29 ENCOUNTER — Telehealth: Payer: Self-pay

## 2022-10-29 NOTE — Telephone Encounter (Signed)
PA request has been Submitted. New Encounter created for follow up. For additional info see Pharmacy Prior Auth telephone encounter from 10/29/2022.

## 2022-10-29 NOTE — Telephone Encounter (Signed)
Pharmacy Patient Advocate Encounter   Received notification from Physician's Office that prior authorization for Emgality 120MG /ML auto-injectors (migraine) is required/requested.   Insurance verification completed.   The patient is insured through [facility] .   Per test claim: PA required; PA submitted to [facility] via CoverMyMeds Key/confirmation #/EOC [facility] Status is pending

## 2022-10-30 ENCOUNTER — Other Ambulatory Visit (HOSPITAL_COMMUNITY): Payer: Self-pay

## 2022-10-30 NOTE — Telephone Encounter (Signed)
Pharmacy Patient Advocate Encounter  Received notification from [facility] that Prior Authorization for Emgality 120MG /ML auto-injectors (migraine) has been APPROVED from 10/29/2022 to 10/29/2023. Ran test claim, Copay is $25.00. This test claim was processed through [facility]- copay amounts may vary at other pharmacies due to pharmacy/plan contracts, or as the patient moves through the different stages of their insurance plan.   PA #/Case ID/Reference #: [id number]

## 2022-10-30 NOTE — Telephone Encounter (Signed)
Thanks, I let the patient know.

## 2023-03-18 ENCOUNTER — Encounter: Payer: Self-pay | Admitting: Adult Health

## 2023-03-18 ENCOUNTER — Encounter: Payer: Self-pay | Admitting: *Deleted

## 2023-03-18 ENCOUNTER — Other Ambulatory Visit (HOSPITAL_COMMUNITY): Payer: Self-pay

## 2023-03-18 ENCOUNTER — Telehealth: Payer: Self-pay | Admitting: *Deleted

## 2023-03-18 ENCOUNTER — Telehealth: Admitting: Adult Health

## 2023-03-18 ENCOUNTER — Telehealth: Payer: Self-pay

## 2023-03-18 DIAGNOSIS — G43709 Chronic migraine without aura, not intractable, without status migrainosus: Secondary | ICD-10-CM

## 2023-03-18 DIAGNOSIS — G43909 Migraine, unspecified, not intractable, without status migrainosus: Secondary | ICD-10-CM | POA: Diagnosis not present

## 2023-03-18 MED ORDER — NURTEC 75 MG PO TBDP: ORAL_TABLET | ORAL | 11 refills | Status: DC

## 2023-03-18 MED ORDER — AJOVY 225 MG/1.5ML ~~LOC~~ SOAJ: SUBCUTANEOUS | 11 refills | Status: DC

## 2023-03-18 NOTE — Telephone Encounter (Signed)
 PA request has been Submitted. New Encounter has been or will be created for follow up. For additional info see Pharmacy Prior Auth telephone encounter from 03/18/2023.

## 2023-03-18 NOTE — Telephone Encounter (Signed)
 Pharmacy Patient Advocate Encounter   Received notification from Pt Calls Messages that prior authorization for AJOVY (fremanezumab-vfrm) injection 225MG /1.5ML auto-injectors is required/requested.   Insurance verification completed.   The patient is insured through [facility] .   Per test claim: PA required; PA submitted to above mentioned insurance via CoverMyMeds Key/confirmation #/EOC [id number] Status is pending

## 2023-03-18 NOTE — Progress Notes (Signed)
 PATIENT: SHEKELIA BOUTIN DOB: 30-Mar-1975  REASON FOR VISIT: follow up HISTORY FROM: patient  Virtual Visit via Video Note  I connected with Darylene Lenzo on 03/18/23 at 11:30 AM EDT by a video enabled telemedicine application located remotely at [facility] and verified that I am speaking with the correct person using two identifiers who was located at their own home.   I discussed the limitations of evaluation and management by telemedicine and the availability of in person appointments. The patient expressed understanding and agreed to proceed.   PATIENT: OCEANNA ARRUDA DOB: [birth year]  REASON FOR VISIT: follow up HISTORY FROM: patient  HISTORY OF PRESENT ILLNESS: Today 03/18/23:  Darylene Leffel is a 48 y.o. female with a history of migraine headaches. Returns today for follow-up.  Last year her insurance switched and they would not cover Ajovy.  She was forced to try Emgality.  She states that she has had more frequent headaches.  She states that she is going on day 3 with a migraine.  Reports that last week she had a migraine Thursday and Friday.  This week she has had a migraine Monday Tuesday and today.  She states that she has taken Relpax today and it has lessened the headache.  She does state when she takes Relpax it makes her extremely sleepy and she is unable to work.  She has been out of work since Monday.  When she was on Ajovy her headaches was under better control.  She returns today for an evaluation.  05/02/22: Darylene Helder is a 48 y.o. female with a history of Migraines. Returns today for follow-up. Remains on Ajovy. Working well! Reports that she have 2-3 migraines a month if that. Usually has a migraine around her menstrual cycle. Has tried Relapax- has only taken it a couple of times and feels that it did help. Usually uses OTC medication.   HISTORY (copied from Dr. Trevor Mace note) 01/15/2021: Her insurance has changed. His new insurance will not  happen until April. Ajovy helps tremendously. She went to get a refill for this month, it was approved but it ws going to be $600 because of her deductable. We are trying the new copay card, we provided the Ajovy foundation, and we are calling the pharmact to see, she has done so well on Ajovy would be a shame, giving 4 months samples until new insurance in April. No other focal neurologic deficits, associated symptoms, inciting events or modifiable factors.    REVIEW OF SYSTEMS: Out of a complete 14 system review of symptoms, the patient complains only of the following symptoms, and all other reviewed systems are negative.  ALLERGIES: Allergies  Allergen Reactions   Sulfonamide Derivatives     HOME MEDICATIONS: Outpatient Medications Prior to Visit  Medication Sig Dispense Refill   Ascorbic Acid (VITAMIN C GUMMIE PO) Take by mouth.     Bacillus Coagulans-Inulin (PROBIOTIC-PREBIOTIC) 1-250 BILLION-MG CAPS Take by mouth.     chlordiazePOXIDE (LIBRIUM) 5 MG capsule Take 5 mg by mouth every morning.     eszopiclone (LUNESTA) 2 MG TABS tablet 3 mg.     fluticasone (FLONASE) 50 MCG/ACT nasal spray Place 2 sprays into the nose daily. 16 g 6   Galcanezumab-gnlm (EMGALITY) 120 MG/ML SOAJ Inject 120 mg into the skin every 30 (thirty) days. 1 mL 5   hydrocortisone (ANUSOL-HC) 2.5 % rectal cream Place rectally 2 (two) times daily. 30 g 1   loratadine (CLARITIN) 10  MG tablet Take 10 mg by mouth daily.     Multiple Vitamins-Minerals (MULTIVITAMIN PO) Take by mouth.     Norethindrone Acetate-Ethinyl Estrad-FE (HAILEY 24 FE) 1-20 MG-MCG(24) tablet Take 1 tablet by mouth daily. 84 tablet 3   No facility-administered medications prior to visit.    PAST MEDICAL HISTORY: Past Medical History:  Diagnosis Date   Anxiety disorder    GERD (gastroesophageal reflux disease)    High cholesterol    Kidney stones    Migraine without aura    Migraines    Palpitations    S/P endoscopy 03/06/2009   mild  gastritis, continue Nexium    PAST SURGICAL HISTORY: Past Surgical History:  Procedure Laterality Date   KIDNEY STONES     IN HER 20s stint   TONSILLECTOMY  1983    FAMILY HISTORY: Family History  Problem Relation Age of Onset   Cancer Maternal Grandmother    Prostate cancer Maternal Grandfather    Colon cancer Maternal Grandfather    Lung cancer Paternal Grandmother    Diabetes Paternal Grandfather    Ovarian cancer Maternal Aunt        moms identical twin   Other Maternal Aunt    Migraines Neg Hx     SOCIAL HISTORY: Social History   Socioeconomic History   Marital status: Married    Spouse name: Gerri Spore    Number of children: 0   Years of education: some colle   Highest education level: Not on file  Occupational History   Occupation: [employer]  Tobacco Use   Smoking status: Never   Smokeless tobacco: Never  Vaping Use   Vaping status: Never Used  Substance and Sexual Activity   Alcohol use: No   Drug use: No   Sexual activity: Yes    Birth control/protection: OCP  Other Topics Concern   Not on file  Social History Narrative   Lives with husband   Caffeine use: 1 cup daily (coffee), occasional soda, mostly water   Right-handed   Social Drivers of Health   Financial Resource Strain: Not on file  Food Insecurity: Not on file  Transportation Needs: Not on file  Physical Activity: Not on file  Stress: Not on file  Social Connections: Not on file  Intimate Partner Violence: Not on file      PHYSICAL EXAM Generalized: Well developed, in no acute distress   Neurological examination  Mentation: Alert oriented to time, place, history taking. Follows all commands speech and language fluent Cranial nerve II-XII: Facial symmetry noted  DIAGNOSTIC DATA (LABS, IMAGING, TESTING) - I reviewed patient records, labs, notes, testing and imaging myself where available.  Lab Results  Component Value Date   WBC 8.6 02/21/2021   HGB 14.4  02/21/2021   HCT 42.9 02/21/2021   MCV 93.1 02/21/2021   PLT 324 02/21/2021      Component Value Date/Time   NA 138 04/25/2022 0921   NA 140 01/03/2019 1000   K 4.2 04/25/2022 0921   CL 104 04/25/2022 0921   CO2 24 04/25/2022 0921   GLUCOSE 83 04/25/2022 0921   BUN 13 04/25/2022 0921   BUN 13 01/03/2019 1000   CREATININE 0.97 04/25/2022 0921   CALCIUM 9.7 04/25/2022 0921   PROT 6.6 04/25/2022 0921   PROT 6.7 01/03/2019 1000   ALBUMIN 3.9 01/03/2019 1000   AST 17 04/25/2022 0921   ALT 12 04/25/2022 0921   ALKPHOS 42 01/03/2019 1000   BILITOT 0.3 04/25/2022 7829  BILITOT <0.2 01/03/2019 1000   GFRNONAA 96 01/03/2019 1000   GFRAA 110 01/03/2019 1000   Lab Results  Component Value Date   CHOL 198 04/25/2022   HDL 49 (L) 04/25/2022   LDLCALC 114 (H) 04/25/2022   TRIG 228 (H) 04/25/2022   CHOLHDL 4.0 04/25/2022    Lab Results  Component Value Date   TSH 2.04 02/21/2021      ASSESSMENT AND PLAN 48 y.o. year old female  has a past medical history of Anxiety disorder, GERD (gastroesophageal reflux disease), High cholesterol, Kidney stones, Migraine without aura, Migraines, Palpitations, and S/P endoscopy (03/06/2009). here with:  Migraines   Will reorder Ajovy to see if insurance will cover If Ajovy approved she will stop Emgality.  If we are unable to get Ajovy approved we will consider switching to Bennie Pierini Will also try Nurtec for abortive therapy.  Advised to take 1 tablet at the onset of a migraine only 1 tablet in 24 hours.  I have reviewed potential side effects and contraindications with the patient.  Also provided her information on her AVS. she verbalized understanding. Continue Relapax if needed for abortive therapy.  Did caution the patient that overuse of Relpax will cause rebound headaches.  She voiced understanding FU in 1 year or sooner if needed       Butch Penny, MSN, NP-C 03/18/2023, 11:24 AM [facility] [address]  [address] [place], [place] [address] [phone]

## 2023-03-18 NOTE — Patient Instructions (Signed)
 Your Plan:  Stop Emgality  Will order Ajovy monthly injection  Try Nurtec 75 mg at the onset of migraine. Only 1 tablet 24 hours If Ajovy is not approved we will try Qulipta  If your symptoms worsen or you develop new symptoms please let us know.   Thank you for coming to see [place] at [facility]. I hope we have been able to provide you high quality care today.  You may receive a patient satisfaction survey over the next few weeks. We would appreciate your feedback and comments so that we may continue to improve ourselves and the health of our patients.'

## 2023-03-18 NOTE — Telephone Encounter (Signed)
-----   Message from [facility] sent at 03/18/2023 11:54 AM EDT ----- 1: Can we write her a letter and put on mychartexcusing her from work Monday Tuesday and today 2: Can we also reach out to [facility] and see if she can expedite Ajovy prior Auth. Not sure if insurance will approve- can we appeal if not?

## 2023-03-18 NOTE — Telephone Encounter (Signed)
 Please initiale Ajovy PA and need appeal.

## 2023-03-18 NOTE — Telephone Encounter (Signed)
 Pt needs Nurtec PA asap please. Thank you!

## 2023-03-19 ENCOUNTER — Other Ambulatory Visit: Payer: Self-pay | Admitting: *Deleted

## 2023-03-19 ENCOUNTER — Other Ambulatory Visit (HOSPITAL_COMMUNITY): Payer: Self-pay

## 2023-03-19 ENCOUNTER — Telehealth: Payer: Self-pay

## 2023-03-19 DIAGNOSIS — G43709 Chronic migraine without aura, not intractable, without status migrainosus: Secondary | ICD-10-CM

## 2023-03-19 MED ORDER — ELETRIPTAN HYDROBROMIDE 40 MG PO TABS: 40.0000 mg | ORAL_TABLET | ORAL | 11 refills | Status: DC | PRN

## 2023-03-19 NOTE — Telephone Encounter (Signed)
 Pharmacy Patient Advocate Encounter   Received notification from Pt Calls Messages that prior authorization for Nurtec 75MG  dispersible tablets is required/requested.   Insurance verification completed.   The patient is insured through [facility] .   Per test claim: PA required; PA submitted to above mentioned insurance via CoverMyMeds Key/confirmation #/EOC [id number] Status is pending

## 2023-03-19 NOTE — Addendum Note (Signed)
 Addended by: Bertram Savin on: 03/19/2023 04:37 PM   Modules accepted: Orders

## 2023-03-19 NOTE — Telephone Encounter (Signed)
 Pharmacy Patient Advocate Encounter  Received notification from [facility] that Prior Authorization for AJOVY (fremanezumab-vfrm) injection 225MG /1.5ML auto-injectors has been DENIED.  Full denial letter will be uploaded to the media tab. See denial reason below.  The request for coverage for AJOVY INJ 225/1.5, use as directed (1.75ml per month), is denied. This decision is based on health plan criteria for AJOVY INJ225/1.5. This medicine is covered only if: You have failed (after a trial of at least three months) or cannot use the following (document date tried):  (A) Aimovig*  *Please note: This product may require prior authorization  PA #/Case ID/Reference #: [id number]

## 2023-03-19 NOTE — Telephone Encounter (Signed)
 What about qulipta? Will your insurance cover that?

## 2023-03-19 NOTE — Telephone Encounter (Signed)
 Let the patient know that her insurance is requiring Aimovig.  Let her know potential side effects- one being constipation.  Certainly can stop it if she notices that's an issue.

## 2023-03-19 NOTE — Telephone Encounter (Signed)
 PA request has been Submitted. New Encounter has been or will be created for follow up. For additional info see Pharmacy Prior Auth telephone encounter from 03-19-2023.

## 2023-03-19 NOTE — Telephone Encounter (Signed)
 Test claim for the Bennie Pierini shows it requires a PA with step therapy history of 2 of the following: Amitriptyline, Candesartan, Divalproex, Venlafaxine, Botox, Topiramate

## 2023-03-19 NOTE — Telephone Encounter (Signed)
 Pharmacy Patient Advocate Encounter  Received notification from [facility] that Prior Authorization for Nurtec 75MG  dispersible tablets has been APPROVED from 03-19-2023 to 03-18-2024   PA #/Case ID/Reference #: [id number]

## 2023-03-20 MED ORDER — AIMOVIG 70 MG/ML ~~LOC~~ SOAJ: 70.0000 mg | SUBCUTANEOUS | 5 refills | Status: DC

## 2023-03-20 NOTE — Addendum Note (Signed)
 Addended by: Enedina Finner on: 03/20/2023 10:01 AM   Modules accepted: Orders

## 2023-03-20 NOTE — Telephone Encounter (Signed)
 I reviewed her chart I did not see any recent cardiovascular issues as well.  Signed prescription for Aimovig

## 2023-03-23 ENCOUNTER — Other Ambulatory Visit (HOSPITAL_COMMUNITY): Payer: Self-pay

## 2023-03-23 ENCOUNTER — Telehealth: Payer: Self-pay

## 2023-03-23 ENCOUNTER — Telehealth: Payer: Self-pay | Admitting: *Deleted

## 2023-03-23 NOTE — Telephone Encounter (Signed)
 Tried: Keppra, Nortriptyline, Imitrex. (from Dr. Trevor Mace first office visit).

## 2023-03-23 NOTE — Telephone Encounter (Signed)
   Insurance is asking if PT has tried these specific medications-I do not see any other than propranolol-please advise

## 2023-03-23 NOTE — Telephone Encounter (Signed)
 Aimovig PA needed, thanks! Pt has to try it before she can get Ajovy again.

## 2023-03-23 NOTE — Telephone Encounter (Signed)
 noted

## 2023-03-24 ENCOUNTER — Other Ambulatory Visit (HOSPITAL_COMMUNITY): Payer: Self-pay

## 2023-03-24 NOTE — Telephone Encounter (Signed)
 Thanks, I notified the patient.

## 2023-03-24 NOTE — Telephone Encounter (Signed)
 I will submit as is and see what response we get. And Thank you!  Pharmacy Patient Advocate Encounter   Received notification from Physician's Office that prior authorization for Aimovig 70MG /ML auto-injectors is required/requested.   Insurance verification completed.   The patient is insured through [facility] .   Per test claim: PA required; PA submitted to above mentioned insurance via CoverMyMeds Key/confirmation #/EOC [id number] Status is pending

## 2023-03-24 NOTE — Telephone Encounter (Signed)
 Pharmacy Patient Advocate Encounter  Received notification from [facility] that Prior Authorization for Aimovig 70MG /ML auto-injectors has been APPROVED from 03/24/2023 to 03/23/2024. Ran test claim, Copay is $25.00. This test claim was processed through [facility]- copay amounts may vary at other pharmacies due to pharmacy/plan contracts, or as the patient moves through the different stages of their insurance plan.   PA #/Case ID/Reference #: PA Case ID #: [id number]

## 2023-04-07 ENCOUNTER — Encounter: Payer: Self-pay | Admitting: *Deleted

## 2023-04-07 NOTE — Telephone Encounter (Signed)
 Spoke with Aundra Millet NP. We can write letter for Monday-Tuesday of this week but not for Wednesday and Thursday.  If patient is having migraine unresponsive to medications at home for 24 hours, she should seek treatment. She can go to urgent care, do same-day mychart video visit by signing up at [email or web address] or she can call us to see if we have availability. If patient is taking eletriptan daily, this can cause rebound headaches. I called the patient and LVM (ok per DPR) with this information. Informed pt I would send letter through mychart. Left office number for call back if needed.

## 2023-04-27 ENCOUNTER — Encounter: Payer: Self-pay | Admitting: Nurse Practitioner

## 2023-04-27 DIAGNOSIS — Z3041 Encounter for surveillance of contraceptive pills: Secondary | ICD-10-CM

## 2023-04-28 MED ORDER — HAILEY 24 FE 1-20 MG-MCG(24) PO TABS: 1.0000 | ORAL_TABLET | Freq: Every day | ORAL | 0 refills | Status: DC

## 2023-04-28 NOTE — Telephone Encounter (Signed)
 Med refill request:Hailey  Fe Last AEX: 04/25/22 -JJ Next AEX: 05/28/23 -TW Last MMG (if hormonal med) 05/09/22, BiRads 1 neg  Dr. Colvin Dec -can you review. Rx pended for 3 mo supply.  Refill authorized: Please Advise?

## 2023-05-06 ENCOUNTER — Encounter: Payer: Self-pay | Admitting: Adult Health

## 2023-05-06 MED ORDER — METHYLPREDNISOLONE 4 MG PO TBPK: ORAL_TABLET | ORAL | 0 refills | Status: AC

## 2023-05-06 NOTE — Telephone Encounter (Signed)
 Yes lets go ahead and try to write an appeal indicating that she is actually had no improvement at all in her headaches since she started Aimovig .  Okay to try prednisone Dosepak as long as she does not have any contraindications.  I reviewed her chart I did not see any.  Please asked if she has had prednisone before and if she tolerates it okay

## 2023-05-06 NOTE — Telephone Encounter (Signed)
 From Jacqlyn Matas NP:  Clem Currier, NP to Me    05/06/23  3:08 PM Note Yes lets go ahead and try to write an appeal indicating that she is actually had no improvement at all in her headaches since she started Aimovig .  Okay to try prednisone Dosepak as long as she does not have any contraindications.  I reviewed her chart I did not see any.  Please asked if she has had prednisone before and if she tolerates it okay

## 2023-05-06 NOTE — Addendum Note (Signed)
 Addended by: Burns Carwin on: 05/06/2023 05:30 PM   Modules accepted: Orders

## 2023-05-06 NOTE — Telephone Encounter (Signed)
Separate message sent to patient.

## 2023-05-07 ENCOUNTER — Telehealth: Payer: Self-pay | Admitting: Pharmacist

## 2023-05-07 NOTE — Telephone Encounter (Signed)
 Thank you :)

## 2023-05-07 NOTE — Telephone Encounter (Signed)
 Appeal has been submitted for Ajovy . Will advise when response is received, please be advised that most companies may take 30 days to make a decision. Appeal letter and supporting documentation have been faxed to [phone] on 05/07/2022 @5 :34 pm.  Thank you, Dene Fines, PharmD Clinical Pharmacist  [place]  Direct Dial: [phone]

## 2023-05-07 NOTE — Telephone Encounter (Signed)
 Will forward to the pharmacist for an appeals review

## 2023-05-12 ENCOUNTER — Other Ambulatory Visit (HOSPITAL_COMMUNITY): Payer: Self-pay

## 2023-05-19 ENCOUNTER — Other Ambulatory Visit (HOSPITAL_COMMUNITY): Payer: Self-pay

## 2023-05-19 MED ORDER — AJOVY 225 MG/1.5ML ~~LOC~~ SOAJ: SUBCUTANEOUS | 11 refills | Status: DC

## 2023-05-19 NOTE — Addendum Note (Signed)
 Addended by: Inocencio Mania S on: 05/19/2023 12:10 PM   Modules accepted: Orders

## 2023-05-20 ENCOUNTER — Other Ambulatory Visit (HOSPITAL_COMMUNITY): Payer: Self-pay

## 2023-05-20 NOTE — Telephone Encounter (Signed)
 Insurance has approved the appeal for Ajovy , it is effective through 05/2024

## 2023-05-28 ENCOUNTER — Encounter: Payer: Self-pay | Admitting: Nurse Practitioner

## 2023-05-28 ENCOUNTER — Ambulatory Visit (INDEPENDENT_AMBULATORY_CARE_PROVIDER_SITE_OTHER): Admitting: Nurse Practitioner

## 2023-05-28 VITALS — BP 122/82 | HR 109 | Ht 61.25 in | Wt 142.2 lb

## 2023-05-28 DIAGNOSIS — N951 Menopausal and female climacteric states: Secondary | ICD-10-CM

## 2023-05-28 DIAGNOSIS — Z01419 Encounter for gynecological examination (general) (routine) without abnormal findings: Secondary | ICD-10-CM

## 2023-05-28 DIAGNOSIS — Z1331 Encounter for screening for depression: Secondary | ICD-10-CM

## 2023-05-28 DIAGNOSIS — Z3041 Encounter for surveillance of contraceptive pills: Secondary | ICD-10-CM | POA: Diagnosis not present

## 2023-05-28 DIAGNOSIS — R635 Abnormal weight gain: Secondary | ICD-10-CM

## 2023-05-28 DIAGNOSIS — F410 Panic disorder [episodic paroxysmal anxiety] without agoraphobia: Secondary | ICD-10-CM

## 2023-05-28 DIAGNOSIS — G43709 Chronic migraine without aura, not intractable, without status migrainosus: Secondary | ICD-10-CM

## 2023-05-28 DIAGNOSIS — E785 Hyperlipidemia, unspecified: Secondary | ICD-10-CM

## 2023-05-28 DIAGNOSIS — Z1211 Encounter for screening for malignant neoplasm of colon: Secondary | ICD-10-CM

## 2023-05-28 DIAGNOSIS — N898 Other specified noninflammatory disorders of vagina: Secondary | ICD-10-CM

## 2023-05-28 MED ORDER — HAILEY 24 FE 1-20 MG-MCG(24) PO TABS: 1.0000 | ORAL_TABLET | Freq: Every day | ORAL | 3 refills | Status: AC

## 2023-05-28 NOTE — Progress Notes (Signed)
 Bridget Parker 01-03-1976 [id number]   History:  48 y.o. G0 presents for annual exam. Light monthly cycles. Complains of weight gain, brain fog, difficulty concentrating, increased anxiety, vaginal dryness. Would like thyroid  level checked. Normal pap history. H/O migraine without aura. Headaches have increased lately. Restarted on Ajovy  since she did well on this in the past. Feels these are worse around her menses. Sees psychiatry for panic attacks. Feels these have gotten worse.   Gynecologic History Patient's last menstrual period was 05/21/2023 (exact date). Period Cycle (Days): 28 Period Duration (Days): 3 Period Pattern: Regular Menstrual Flow: Light Menstrual Control: Panty liner Dysmenorrhea: (!) Mild Dysmenorrhea Symptoms: Cramping Contraception/Family planning: OCP (estrogen/progesterone) Sexually active: Yes  Health Maintenance Last Pap: 04/25/2022. Results were: Normal neg HPV Last mammogram: 05/09/2022. Results were: Normal Last colonoscopy: Never Last Dexa: Not indicated     05/28/2023    3:34 PM  Depression screen PHQ 2/9  Decreased Interest 2  Down, Depressed, Hopeless 2  PHQ - 2 Score 4  Altered sleeping 1  Tired, decreased energy 1  Change in appetite 0  Feeling bad or failure about yourself  0  Trouble concentrating 1  Moving slowly or fidgety/restless 1  Suicidal thoughts 0  PHQ-9 Score 8  Difficult doing work/chores Somewhat difficult     Past medical history, past surgical history, family history and social history were all reviewed and documented in the EPIC chart. 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   ROS:  A ROS was performed and pertinent positives and negatives are included.  Exam:  Vitals:   05/28/23 1532  BP: 122/82  Pulse: (!) 109  SpO2: 95%  Weight: 142 lb 3.2 oz (64.5 kg)  Height: 5' 1.25" (1.556 m)   Body mass index is 26.65 kg/m.  General appearance:  Normal Thyroid :  Symmetrical, normal in size, without palpable masses or  nodularity. Respiratory  Auscultation:  Clear without wheezing or rhonchi Cardiovascular  Auscultation:  Regular rate, without rubs, murmurs or gallops  Edema/varicosities:  Not grossly evident Abdominal  Soft,nontender, without masses, guarding or rebound.  Liver/spleen:  No organomegaly noted  Hernia:  None appreciated  Skin  Inspection:  Grossly normal Breasts: Examined lying and sitting.   Right: Without masses, retractions, nipple discharge or axillary adenopathy.   Left: Without masses, retractions, nipple discharge or axillary adenopathy. Pelvic: External genitalia:  no lesions              Urethra:  normal appearing urethra with no masses, tenderness or lesions              Bartholins and Skenes: normal                 Vagina: normal appearing vagina with normal color and discharge, no lesions              Cervix: no lesions Bimanual Exam:  Uterus:  no masses or tenderness              Adnexa: no mass, fullness, tenderness              Rectovaginal: Deferred              Anus:  normal, no lesions  Patient informed chaperone available to be present for breast and pelvic exam. Patient has requested no chaperone to be present. Patient has been advised what will be completed during breast and pelvic exam.   Assessment/Plan:  47 y.o. G0 for annual exam.   Well female exam with  routine gynecological exam - Plan: CBC with Differential/Platelet, Comprehensive metabolic panel with GFR. Education provided on SBEs, importance of preventative screenings, current guidelines, high calcium diet, regular exercise, and multivitamin daily.   Perimenopause - many symptoms likely d/t perimenopause. Discussed what to expect and supplements.   Encounter for surveillance of contraceptive pills - Plan: Norethindrone Acetate-Ethinyl Estrad-FE (HAILEY  24 FE) 1-20 MG-MCG(24) tablet daily.   Hyperlipidemia, unspecified hyperlipidemia type - Plan: Lipid panel  Weight gain - Plan: TSH  Screening  for colon cancer - Plan: Cologuard. Discussed Cologuard versus Colonoscopy.   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 Restarted on Ajovy  since she did well on this in the past. Feels these are worse around her menses. Informed can worsen during perimenopause. Will try COCs continuously.  Panic attacks - sees psychiatry. Plans to discuss with them. Takes Librium.   Vaginal dryness - UberLube samples provided. Recommend oil or silicone based lubricants.   Screening for cervical cancer - Normal Pap history.  Will repeat at 5-year interval per guidelines.  Screening for breast cancer - Normal mammogram history.  Continue annual screenings.  Normal breast exam today.  Return in about 1 year (around 05/27/2024) for Annual.     Andee Bamberger DNP, 4:13 PM 05/28/2023

## 2023-05-29 LAB — COMPREHENSIVE METABOLIC PANEL WITH GFR
AG Ratio: 1.3 (calc) (ref 1.0–2.5)
ALT: 24 U/L (ref 6–29)
AST: 23 U/L (ref 10–35)
Albumin: 4 g/dL (ref 3.6–5.1)
Alkaline phosphatase (APISO): 46 U/L (ref 31–125)
BUN: 13 mg/dL (ref 7–25)
CO2: 26 mmol/L (ref 20–32)
Calcium: 9.3 mg/dL (ref 8.6–10.2)
Chloride: 103 mmol/L (ref 98–110)
Creat: 0.84 mg/dL (ref 0.50–0.99)
Globulin: 3 g/dL (ref 1.9–3.7)
Glucose, Bld: 90 mg/dL (ref 65–99)
Potassium: 3.8 mmol/L (ref 3.5–5.3)
Sodium: 137 mmol/L (ref 135–146)
Total Bilirubin: 0.3 mg/dL (ref 0.2–1.2)
Total Protein: 7 g/dL (ref 6.1–8.1)
eGFR: 86 mL/min/{1.73_m2} (ref >=60)

## 2023-05-29 LAB — CBC WITH DIFFERENTIAL/PLATELET
Absolute Lymphocytes: 1532 {cells}/uL (ref 850–3900)
Absolute Monocytes: 884 {cells}/uL (ref 200–950)
Basophils Absolute: 66 {cells}/uL (ref 0–200)
Basophils Relative: 0.7 %
Eosinophils Absolute: 47 {cells}/uL (ref 15–500)
Eosinophils Relative: 0.5 %
HCT: 43.5 % (ref 35.0–45.0)
Hemoglobin: 14.5 g/dL (ref 11.7–15.5)
MCH: 30.6 pg (ref 27.0–33.0)
MCHC: 33.3 g/dL (ref 32.0–36.0)
MCV: 91.8 fL (ref 80.0–100.0)
MPV: 11.3 fL (ref 7.5–12.5)
Monocytes Relative: 9.4 %
Neutro Abs: 6871 {cells}/uL (ref 1500–7800)
Neutrophils Relative %: 73.1 %
Platelets: 284 10*3/uL (ref 140–400)
RBC: 4.74 10*6/uL (ref 3.80–5.10)
RDW: 11.8 % (ref 11.0–15.0)
Total Lymphocyte: 16.3 %
WBC: 9.4 10*3/uL (ref 3.8–10.8)

## 2023-05-29 LAB — LIPID PANEL
Cholesterol: 204 mg/dL — ABNORMAL HIGH (ref <200)
HDL: 60 mg/dL (ref >=50)
LDL Cholesterol (Calc): 119 mg/dL — ABNORMAL HIGH
Non-HDL Cholesterol (Calc): 144 mg/dL — ABNORMAL HIGH (ref <130)
Total CHOL/HDL Ratio: 3.4 (calc) (ref <5.0)
Triglycerides: 137 mg/dL (ref <150)

## 2023-05-29 LAB — TSH: TSH: 1.87 m[IU]/L

## 2023-06-02 ENCOUNTER — Ambulatory Visit: Payer: Self-pay | Admitting: Nurse Practitioner

## 2023-06-25 ENCOUNTER — Telehealth: Admitting: Adult Health

## 2023-06-25 DIAGNOSIS — G43709 Chronic migraine without aura, not intractable, without status migrainosus: Secondary | ICD-10-CM

## 2023-06-25 DIAGNOSIS — G43009 Migraine without aura, not intractable, without status migrainosus: Secondary | ICD-10-CM | POA: Diagnosis not present

## 2023-06-25 NOTE — Progress Notes (Signed)
 PATIENT: Bridget Parker DOB: [birth year]  REASON FOR VISIT: follow up HISTORY FROM: patient  Virtual Visit via Video Note  I connected with Bridget Parker on 06/25/23 at 11:45 AM EDT by a video enabled telemedicine application located remotely at home office  and verified that I am speaking with the correct person using two identifiers who was located at their own home in [place]   I discussed the limitations of evaluation and management by telemedicine and the availability of in person appointments. The patient expressed understanding and agreed to proceed.   PATIENT: Bridget Parker DOB: [birth year]  REASON FOR VISIT: follow up HISTORY FROM: patient  HISTORY OF PRESENT ILLNESS: Today 06/25/23:  Bridget Parker is a 48 y.o. female with a history of migraine headaches. Returns today for follow-up.  Patient is now on Ajovy .  She states that that has been extremely beneficial.  She states in June she has had no severe migraines.  She also states that her OB/GYN changed her birth control to where she does not have a cycle.  Overall she feels that this is working much better for her.  She continues to have Relpax  when needed.  She does state that last month she did have a zigzag line in her right periphery.  She states that it came on and resolved in less than 5 minutes.  This was not followed by headache or any other symptoms.  She returns today for an evaluation.   Bridget Parker is a 48 y.o. female with a history of migraine headaches. Returns today for follow-up.  Last year her insurance switched and they would not cover Ajovy .  She was forced to try Emgality .  She states that she has had more frequent headaches.  She states that she is going on day 3 with a migraine.  Reports that last week she had a migraine Thursday and Friday.  This week she has had a migraine Monday Tuesday and today.  She states that she has taken Relpax  today and it has lessened the headache.  She does state when she  takes Relpax  it makes her extremely sleepy and she is unable to work.  She has been out of work since Monday.  When she was on Ajovy  her headaches was under better control.  She returns today for an evaluation.  05/02/22: Bridget Parker is a 48 y.o. female with a history of Migraines. Returns today for follow-up. Remains on Ajovy . Working well! Reports that she have 2-3 migraines a month if that. Usually has a migraine around her menstrual cycle. Has tried Relapax- has only taken it a couple of times and feels that it did help. Usually uses OTC medication.   HISTORY (copied from Dr. Harding Li note) 01/15/2021: Her insurance has changed. His new insurance will not happen until April. Ajovy  helps tremendously. She went to get a refill for this month, it was approved but it ws going to be $600 because of her deductable. We are trying the new copay card, we provided the Ajovy  foundation, and we are calling the pharmact to see, she has done so well on Ajovy  would be a shame, giving 4 months samples until new insurance in April. No other focal neurologic deficits, associated symptoms, inciting events or modifiable factors.    REVIEW OF SYSTEMS: Out of a complete 14 system review of symptoms, the patient complains only of the following symptoms, and all other reviewed systems are negative.  ALLERGIES: Allergies  Allergen Reactions   Sulfonamide Derivatives     HOME MEDICATIONS: Outpatient Medications Prior to Visit  Medication Sig Dispense Refill   Ascorbic Acid (VITAMIN C GUMMIE PO) Take by mouth. (Patient not taking: Reported on 05/28/2023)     Bacillus Coagulans-Inulin (PROBIOTIC-PREBIOTIC) 1-250 BILLION-MG CAPS Take by mouth. (Patient not taking: Reported on 05/28/2023)     chlordiazePOXIDE (LIBRIUM) 5 MG capsule Take 5 mg by mouth every morning.     eletriptan  (RELPAX ) 40 MG tablet Take 1 tablet (40 mg total) by mouth as needed for migraine or headache. May repeat in 2 hours if headache persists or  recurs. (Max 2 tabs/24hrs) 9 tablet 11   eszopiclone (LUNESTA) 2 MG TABS tablet 3 mg.     fluticasone  (FLONASE ) 50 MCG/ACT nasal spray Place 2 sprays into the nose daily. 16 g 6   Fremanezumab -vfrm (AJOVY ) 225 MG/1.5ML SOAJ Inject into skin every 30 days. 1.68 mL 11   hydrocortisone  (ANUSOL -HC) 2.5 % rectal cream Place rectally 2 (two) times daily. (Patient not taking: Reported on 05/28/2023) 30 g 1   loratadine (CLARITIN) 10 MG tablet Take 10 mg by mouth daily.     MELATONIN PO Take by mouth.     methylPREDNISolone  (MEDROL  DOSEPAK) 4 MG TBPK tablet Follow pack instructions. Take with food. (Patient not taking: Reported on 05/28/2023) 21 tablet 0   Multiple Vitamins-Minerals (MULTIVITAMIN PO) Take by mouth.     Norethindrone Acetate-Ethinyl Estrad-FE (HAILEY  24 FE) 1-20 MG-MCG(24) tablet Take 1 tablet by mouth daily. Take ACTIVE pills only, skipping PLACEBO pills 84 tablet 3   Rimegepant Sulfate (NURTEC) 75 MG TBDP Take one tablet at the onset of migraine. Only 1 tablet in 24 hours. 15 tablet 11   Erenumab -aooe (AIMOVIG ) 70 MG/ML SOAJ Inject 70 mg into the skin every 30 (thirty) days. (Patient not taking: Reported on 05/28/2023) 1 mL 5   No facility-administered medications prior to visit.    PAST MEDICAL HISTORY: Past Medical History:  Diagnosis Date   Anxiety disorder    GERD (gastroesophageal reflux disease)    High cholesterol    Kidney stones    Migraine without aura    Migraines    Palpitations    S/P endoscopy 03/06/2009   mild gastritis, continue Nexium     PAST SURGICAL HISTORY: Past Surgical History:  Procedure Laterality Date   KIDNEY STONES     IN HER 20s stint   TONSILLECTOMY  1983    FAMILY HISTORY: Family History  Problem Relation Age of Onset   Hypertension Mother    Hyperlipidemia Mother    Ovarian cancer Maternal Aunt        moms identical twin   Other Maternal Aunt    Cancer Maternal Grandmother    Prostate cancer Maternal Grandfather    Colon cancer  Maternal Grandfather    Lung cancer Paternal Grandmother    Diabetes Paternal Grandfather    Migraines Neg Hx     SOCIAL HISTORY: Social History   Socioeconomic History   Marital status: Married    Spouse name: Melodee Spruce    Number of children: 0   Years of education: some colle   Highest education level: Not on file  Occupational History   Occupation: [employer]  Tobacco Use   Smoking status: Never    Passive exposure: Never   Smokeless tobacco: Never  Vaping Use   Vaping status: Never Used  Substance and Sexual Activity   Alcohol use: No   Drug use: No   Sexual  activity: Yes    Partners: Male    Birth control/protection: OCP  Other Topics Concern   Not on file  Social History Narrative   Lives with husband   Caffeine use: 1 cup daily (coffee), occasional soda, mostly water   Right-handed   Social Drivers of Health   Financial Resource Strain: Not on file  Food Insecurity: Not on file  Transportation Needs: Not on file  Physical Activity: Not on file  Stress: Not on file  Social Connections: Not on file  Intimate Partner Violence: Not on file      PHYSICAL EXAM Generalized: Well developed, in no acute distress   Neurological examination  Mentation: Alert oriented to time, place, history taking. Follows all commands speech and language fluent Cranial nerve II-XII: Facial symmetry noted  DIAGNOSTIC DATA (LABS, IMAGING, TESTING) - I reviewed patient records, labs, notes, testing and imaging myself where available.  Lab Results  Component Value Date   WBC 9.4 05/28/2023   HGB 14.5 05/28/2023   HCT 43.5 05/28/2023   MCV 91.8 05/28/2023   PLT 284 05/28/2023      Component Value Date/Time   NA 137 05/28/2023 1608   NA 140 01/03/2019 1000   K 3.8 05/28/2023 1608   CL 103 05/28/2023 1608   CO2 26 05/28/2023 1608   GLUCOSE 90 05/28/2023 1608   BUN 13 05/28/2023 1608   BUN 13 01/03/2019 1000   CREATININE 0.84 05/28/2023 1608   CALCIUM 9.3  05/28/2023 1608   PROT 7.0 05/28/2023 1608   PROT 6.7 01/03/2019 1000   ALBUMIN 3.9 01/03/2019 1000   AST 23 05/28/2023 1608   ALT 24 05/28/2023 1608   ALKPHOS 42 01/03/2019 1000   BILITOT 0.3 05/28/2023 1608   BILITOT <0.2 01/03/2019 1000   GFRNONAA 96 01/03/2019 1000   GFRAA 110 01/03/2019 1000   Lab Results  Component Value Date   CHOL 204 (H) 05/28/2023   HDL 60 05/28/2023   LDLCALC 119 (H) 05/28/2023   TRIG 137 05/28/2023   CHOLHDL 3.4 05/28/2023    Lab Results  Component Value Date   TSH 1.87 05/28/2023      ASSESSMENT AND PLAN 48 y.o. year old female  has a past medical history of Anxiety disorder, GERD (gastroesophageal reflux disease), High cholesterol, Kidney stones, Migraine without aura, Migraines, Palpitations, and S/P endoscopy (03/06/2009). here with:  Migraine without aura   Continue Ajovy  Continue Relapax if needed for abortive therapy.  Did caution the patient that overuse of Relpax  will cause rebound headaches.  She voiced understanding Patient has had 1 episode of visual aura.  This does not meet the diagnostic criteria for migraine with aura.  I did advise that if she has any other event she should let us  know.  I did educate the patient on migraine with aura and increased stroke risk when taking estrogen.  She will keep me updated FU in 1 year or sooner if needed       Clem Currier, MSN, NP-C 06/25/2023, 1:55 PM [facility] [address] [place], [place] [address] [phone] The patient's condition requires frequent monitoring and adjustments in the treatment plan, reflecting the ongoing complexity of care.  This provider is the continuing focal point for all needed services for this condition.

## 2023-07-08 ENCOUNTER — Encounter: Payer: Self-pay | Admitting: Nurse Practitioner

## 2023-07-20 ENCOUNTER — Encounter: Payer: Self-pay | Admitting: Nurse Practitioner

## 2023-07-29 ENCOUNTER — Other Ambulatory Visit: Payer: Self-pay | Admitting: Adult Health Nurse Practitioner

## 2023-07-29 DIAGNOSIS — Z1231 Encounter for screening mammogram for malignant neoplasm of breast: Secondary | ICD-10-CM

## 2023-08-03 ENCOUNTER — Ambulatory Visit (HOSPITAL_COMMUNITY)
Admission: RE | Admit: 2023-08-03 | Discharge: 2023-08-03 | Disposition: A | Discharge status: Home / Self Care | Source: Ambulatory Visit | Attending: Adult Health Nurse Practitioner | Admitting: Adult Health Nurse Practitioner

## 2023-08-03 DIAGNOSIS — Z1231 Encounter for screening mammogram for malignant neoplasm of breast: Secondary | ICD-10-CM | POA: Insufficient documentation

## 2023-09-30 ENCOUNTER — Encounter: Payer: Self-pay | Admitting: Nurse Practitioner

## 2023-10-07 ENCOUNTER — Encounter: Payer: Self-pay | Admitting: Adult Health

## 2023-10-13 ENCOUNTER — Encounter: Payer: Self-pay | Admitting: Adult Health

## 2023-10-14 ENCOUNTER — Telehealth: Payer: Self-pay | Admitting: *Deleted

## 2023-10-14 ENCOUNTER — Other Ambulatory Visit (HOSPITAL_COMMUNITY): Payer: Self-pay

## 2023-10-14 NOTE — Telephone Encounter (Signed)
 Ajovy  PA needed with new insurance (in media).

## 2023-10-14 NOTE — Telephone Encounter (Signed)
 Pharmacy Patient Advocate Encounter   Received notification from Physician's Office that prior authorization for Ajovy  225mg /1.26ml autoinjector is required/requested.   Insurance verification completed.   The patient is insured through [facility].   Per test claim: PA required; PA submitted to above mentioned insurance via Latent Key/confirmation #/EOC [id number] Status is pending

## 2023-10-16 ENCOUNTER — Other Ambulatory Visit (HOSPITAL_COMMUNITY): Payer: Self-pay

## 2023-10-16 NOTE — Telephone Encounter (Signed)
 Pharmacy Patient Advocate Encounter  Received notification from [facility] that Prior Authorization for Ajovy  has been APPROVED from 10/14/2023 to 10/13/2024   PA #/Case ID/Reference #: [id number]

## 2023-10-16 NOTE — Telephone Encounter (Signed)
 Received a request via fax for add information and clinicals. Faxed completed form to [phone].

## 2023-10-19 ENCOUNTER — Other Ambulatory Visit (HOSPITAL_COMMUNITY): Payer: Self-pay

## 2023-10-19 NOTE — Telephone Encounter (Signed)
 Ok thank you

## 2023-10-19 NOTE — Telephone Encounter (Signed)
 I called pharmacy to clarify what the issue is-per pharmacy PA is not needed-they did receive the approval. With approval and savings card her copay is still high at $412.00. There is nothing further I can assist with-I advised PT to reach out to her plan with any questions and or for an explanation of benefits.

## 2024-01-28 ENCOUNTER — Telehealth: Admitting: Adult Health

## 2024-01-28 ENCOUNTER — Encounter: Payer: Self-pay | Admitting: Adult Health

## 2024-01-28 DIAGNOSIS — G43009 Migraine without aura, not intractable, without status migrainosus: Secondary | ICD-10-CM | POA: Diagnosis not present

## 2024-01-28 DIAGNOSIS — G43709 Chronic migraine without aura, not intractable, without status migrainosus: Secondary | ICD-10-CM

## 2024-01-28 MED ORDER — AJOVY 225 MG/1.5ML ~~LOC~~ SOAJ: SUBCUTANEOUS | 11 refills | Status: DC

## 2024-01-28 MED ORDER — ELETRIPTAN HYDROBROMIDE 40 MG PO TABS: 40.0000 mg | ORAL_TABLET | ORAL | 11 refills | Status: AC | PRN

## 2024-01-28 NOTE — Progress Notes (Signed)
 "    PATIENT: Bridget Parker DOB: 08-Dec-1975  REASON FOR VISIT: follow up HISTORY FROM: patient  Virtual Visit via Video Note  I connected with Bridget Parker on 01/28/24 at  2:30 PM EST by a video enabled telemedicine application located remotely at home office  and verified that I am speaking with the correct person using two identifiers who was located at their own home in [place]   I discussed the limitations of evaluation and management by telemedicine and the availability of in person appointments. The patient expressed understanding and agreed to proceed.   PATIENT: Bridget Parker DOB: 09-Apr-1975  REASON FOR VISIT: follow up HISTORY FROM: patient  HISTORY OF PRESENT ILLNESS: Today 01/28/24:  Bridget Parker is a 49 y.o. female with a history of migraine headaches. Returns today for follow-up.  She remains on Ajovy .  Reports that she has not had any migraine headaches.  She may have 3 regular headaches a month.  For which she can take Advil and the headache resolves.  She does have Relpax  to use when needed.  Overall she feels that things are working well for her.  She returns today for an evaluation.   06/25/23 Bridget Parker is a 49 y.o. female with a history of migraine headaches. Returns today for follow-up.  Patient is now on Ajovy .  She states that that has been extremely beneficial.  She states in June she has had no severe migraines.  She also states that her OB/GYN changed her birth control to where she does not have a cycle.  Overall she feels that this is working much better for her.  She continues to have Relpax  when needed.  She does state that last month she did have a zigzag line in her right periphery.  She states that it came on and resolved in less than 5 minutes.  This was not followed by headache or any other symptoms.  She returns today for an evaluation.   Bridget Parker is a 49 y.o. female with a history of migraine headaches. Returns today for follow-up.  Last year  her insurance switched and they would not cover Ajovy .  She was forced to try Emgality .  She states that she has had more frequent headaches.  She states that she is going on day 3 with a migraine.  Reports that last week she had a migraine Thursday and Friday.  This week she has had a migraine Monday Tuesday and today.  She states that she has taken Relpax  today and it has lessened the headache.  She does state when she takes Relpax  it makes her extremely sleepy and she is unable to work.  She has been out of work since Monday.  When she was on Ajovy  her headaches was under better control.  She returns today for an evaluation.  05/02/22: Bridget Parker is a 49 y.o. female with a history of Migraines. Returns today for follow-up. Remains on Ajovy . Working well! Reports that she have 2-3 migraines a month if that. Usually has a migraine around her menstrual cycle. Has tried Relapax- has only taken it a couple of times and feels that it did help. Usually uses OTC medication.   HISTORY (copied from Dr. Sharion note) 01/15/2021: Her insurance has changed. His new insurance will not happen until April. Ajovy  helps tremendously. She went to get a refill for this month, it was approved but it ws going to be $600 because of her deductable. We  are trying the new copay card, we provided the Ajovy  foundation, and we are calling the pharmact to see, she has done so well on Ajovy  would be a shame, giving 4 months samples until new insurance in April. No other focal neurologic deficits, associated symptoms, inciting events or modifiable factors.    REVIEW OF SYSTEMS: Out of a complete 14 system review of symptoms, the patient complains only of the following symptoms, and all other reviewed systems are negative.  ALLERGIES: Allergies  Allergen Reactions   Sulfonamide Derivatives     HOME MEDICATIONS: Outpatient Medications Prior to Visit  Medication Sig Dispense Refill   Ascorbic Acid (VITAMIN C GUMMIE PO) Take by  mouth. (Patient not taking: Reported on 05/28/2023)     Bacillus Coagulans-Inulin (PROBIOTIC-PREBIOTIC) 1-250 BILLION-MG CAPS Take by mouth. (Patient not taking: Reported on 05/28/2023)     chlordiazePOXIDE (LIBRIUM) 5 MG capsule Take 5 mg by mouth every morning.     eletriptan  (RELPAX ) 40 MG tablet Take 1 tablet (40 mg total) by mouth as needed for migraine or headache. May repeat in 2 hours if headache persists or recurs. (Max 2 tabs/24hrs) 9 tablet 11   eszopiclone (LUNESTA) 2 MG TABS tablet 3 mg.     fluticasone  (FLONASE ) 50 MCG/ACT nasal spray Place 2 sprays into the nose daily. 16 g 6   Fremanezumab -vfrm (AJOVY ) 225 MG/1.5ML SOAJ Inject into skin every 30 days. 1.68 mL 11   hydrocortisone  (ANUSOL -HC) 2.5 % rectal cream Place rectally 2 (two) times daily. (Patient not taking: Reported on 05/28/2023) 30 g 1   loratadine (CLARITIN) 10 MG tablet Take 10 mg by mouth daily.     MELATONIN PO Take by mouth.     methylPREDNISolone  (MEDROL  DOSEPAK) 4 MG TBPK tablet Follow pack instructions. Take with food. (Patient not taking: Reported on 05/28/2023) 21 tablet 0   Multiple Vitamins-Minerals (MULTIVITAMIN PO) Take by mouth.     Norethindrone Acetate-Ethinyl Estrad-FE (HAILEY  24 FE) 1-20 MG-MCG(24) tablet Take 1 tablet by mouth daily. Take ACTIVE pills only, skipping PLACEBO pills 84 tablet 3   Rimegepant Sulfate (NURTEC) 75 MG TBDP Take one tablet at the onset of migraine. Only 1 tablet in 24 hours. 15 tablet 11   No facility-administered medications prior to visit.    PAST MEDICAL HISTORY: Past Medical History:  Diagnosis Date   Anxiety disorder    GERD (gastroesophageal reflux disease)    High cholesterol    Kidney stones    Migraine without aura    Migraines    Palpitations    S/P endoscopy 03/06/2009   mild gastritis, continue Nexium     PAST SURGICAL HISTORY: Past Surgical History:  Procedure Laterality Date   KIDNEY STONES     IN HER 20s stint   TONSILLECTOMY  1983    FAMILY  HISTORY: Family History  Problem Relation Age of Onset   Hypertension Mother    Hyperlipidemia Mother    Ovarian cancer Maternal Aunt        moms identical twin   Other Maternal Aunt    Cancer Maternal Grandmother    Prostate cancer Maternal Grandfather    Colon cancer Maternal Grandfather    Lung cancer Paternal Grandmother    Diabetes Paternal Grandfather    Migraines Neg Hx     SOCIAL HISTORY: Social History   Socioeconomic History   Marital status: Married    Spouse name: Darryle    Number of children: 0   Years of education: some colle   Highest  education level: Not on file  Occupational History   Occupation: [employer]  Tobacco Use   Smoking status: Never    Passive exposure: Never   Smokeless tobacco: Never  Vaping Use   Vaping status: Never Used  Substance and Sexual Activity   Alcohol use: No   Drug use: No   Sexual activity: Yes    Partners: Male    Birth control/protection: OCP  Other Topics Concern   Not on file  Social History Narrative   Lives with husband   Caffeine use: 1 cup daily (coffee), occasional soda, mostly water   Right-handed   Social Drivers of Health   Tobacco Use: Low Risk (12/25/2023)   Received from [facility]   Patient History    Smoking Tobacco Use: Never    Smokeless Tobacco Use: Never    Passive Exposure: Never  Financial Resource Strain: Low Risk (07/13/2023)   Received from [facility]   Overall Financial Resource Strain (CARDIA)    Difficulty of Paying Living Expenses: Not very hard  Food Insecurity: No Food Insecurity (07/13/2023)   Received from [facility]   Epic    Within the past 12 months, you worried that your food would run out before you got the money to buy more.: Never true    Within the past 12 months, the food you bought just didn't last and you didn't have money to get more.: Never true  Transportation Needs: No Transportation Needs (07/13/2023)   [facility] -  Transportation    Lack of Transportation (Medical): No    Lack of Transportation (Non-Medical): No  Physical Activity: Unknown (07/13/2023)   Received from [facility]   Exercise Vital Sign    On average, how many days per week do you engage in moderate to strenuous exercise (like a brisk walk)?: 0 days    Minutes of Exercise per Session: Not on file  Stress: Stress Concern Present (07/13/2023)   Received from [facility] - Occupational Stress Questionnaire    Feeling of Stress : Rather much  Social Connections: Somewhat Isolated (07/13/2023)   Received from [facility]   Social Network    How would you rate your social network (family, work, friends)?: Restricted participation with some degree of social isolation  Intimate Partner Violence: Not At Risk (07/13/2023)   Received from [facility]   HITS    Over the last 12 months how often did your partner physically hurt you?: Never    Over the last 12 months how often did your partner insult you or talk down to you?: Never    Over the last 12 months how often did your partner threaten you with physical harm?: Never    Over the last 12 months how often did your partner scream or curse at you?: Never  Depression (PHQ2-9): Medium Risk (05/28/2023)   Depression (PHQ2-9)    PHQ-2 Score: 8  Alcohol Screen: Not on file  Housing: Low Risk (07/13/2023)   [facility] from [facility]    In the last 12 months, was there a time when you were not able to pay the mortgage or rent on time?: No    In the past 12 months, how many times have you moved where you were living?: 0    At any time in the past 12 months, were you homeless or living in a shelter (including now)?: No  Utilities: Not  At Risk (07/13/2023)   Received from [facility] Utilities    Threatened with loss of utilities: No  Health Literacy: Not on file      PHYSICAL EXAM Generalized: Well developed, in no acute distress    Neurological examination  Mentation: Alert oriented to time, place, history taking. Follows all commands speech and language fluent Cranial nerve II-XII: Facial symmetry noted  DIAGNOSTIC DATA (LABS, IMAGING, TESTING) - I reviewed patient records, labs, notes, testing and imaging myself where available.  Lab Results  Component Value Date   WBC 9.4 05/28/2023   HGB 14.5 05/28/2023   HCT 43.5 05/28/2023   MCV 91.8 05/28/2023   PLT 284 05/28/2023      Component Value Date/Time   NA 137 05/28/2023 1608   NA 140 01/03/2019 1000   K 3.8 05/28/2023 1608   CL 103 05/28/2023 1608   CO2 26 05/28/2023 1608   GLUCOSE 90 05/28/2023 1608   BUN 13 05/28/2023 1608   BUN 13 01/03/2019 1000   CREATININE 0.84 05/28/2023 1608   CALCIUM 9.3 05/28/2023 1608   PROT 7.0 05/28/2023 1608   PROT 6.7 01/03/2019 1000   ALBUMIN 3.9 01/03/2019 1000   AST 23 05/28/2023 1608   ALT 24 05/28/2023 1608   ALKPHOS 42 01/03/2019 1000   BILITOT 0.3 05/28/2023 1608   BILITOT <0.2 01/03/2019 1000   GFRNONAA 96 01/03/2019 1000   GFRAA 110 01/03/2019 1000   Lab Results  Component Value Date   CHOL 204 (H) 05/28/2023   HDL 60 05/28/2023   LDLCALC 119 (H) 05/28/2023   TRIG 137 05/28/2023   CHOLHDL 3.4 05/28/2023    Lab Results  Component Value Date   TSH 1.87 05/28/2023      ASSESSMENT AND PLAN 49 y.o. year old female  has a past medical history of Anxiety disorder, GERD (gastroesophageal reflux disease), High cholesterol, Kidney stones, Migraine without aura, Migraines, Palpitations, and S/P endoscopy (03/06/2009). here with:  Migraine without aura   Continue Ajovy  Continue Relapax if needed for abortive therapy.   FU in 1 year or sooner if needed       Duwaine Russell, MSN, NP-C 01/28/2024, 2:23 PM [facility] [address] [place], [place] [address] [phone] The patient's condition requires frequent monitoring and adjustments in the treatment plan,  reflecting the ongoing complexity of care.  This provider is the continuing focal point for all needed services for this condition.  "

## 2024-02-10 ENCOUNTER — Encounter: Payer: Self-pay | Admitting: Nurse Practitioner

## 2024-02-10 ENCOUNTER — Encounter: Payer: Self-pay | Admitting: Adult Health

## 2024-02-11 MED ORDER — AJOVY 225 MG/1.5ML ~~LOC~~ SOAJ: SUBCUTANEOUS | 11 refills | Status: AC

## 2025-01-26 ENCOUNTER — Telehealth: Admitting: Adult Health
